# Patient Record
Sex: Female | Born: 1945 | Race: Black or African American | Hispanic: No | Smoking: Former smoker
Health system: Southern US, Community
[De-identification: ages and names within clinical notes are randomized; demographics above are authoritative.]

## PROBLEM LIST (undated history)

## (undated) DIAGNOSIS — D649 Anemia, unspecified: Secondary | ICD-10-CM

## (undated) DIAGNOSIS — K649 Unspecified hemorrhoids: Secondary | ICD-10-CM

## (undated) DIAGNOSIS — E8881 Metabolic syndrome: Secondary | ICD-10-CM

## (undated) DIAGNOSIS — R42 Dizziness and giddiness: Secondary | ICD-10-CM

## (undated) DIAGNOSIS — L919 Hypertrophic disorder of the skin, unspecified: Secondary | ICD-10-CM

## (undated) DIAGNOSIS — I10 Essential (primary) hypertension: Secondary | ICD-10-CM

## (undated) DIAGNOSIS — R32 Unspecified urinary incontinence: Secondary | ICD-10-CM

## (undated) DIAGNOSIS — L909 Atrophic disorder of skin, unspecified: Secondary | ICD-10-CM

## (undated) DIAGNOSIS — M1990 Unspecified osteoarthritis, unspecified site: Secondary | ICD-10-CM

## (undated) DIAGNOSIS — M2550 Pain in unspecified joint: Secondary | ICD-10-CM

## (undated) DIAGNOSIS — T7840XA Allergy, unspecified, initial encounter: Secondary | ICD-10-CM

## (undated) DIAGNOSIS — E785 Hyperlipidemia, unspecified: Secondary | ICD-10-CM

## (undated) DIAGNOSIS — H9319 Tinnitus, unspecified ear: Secondary | ICD-10-CM

## (undated) DIAGNOSIS — J45909 Unspecified asthma, uncomplicated: Secondary | ICD-10-CM

## (undated) DIAGNOSIS — L600 Ingrowing nail: Secondary | ICD-10-CM

## (undated) DIAGNOSIS — R51 Headache: Secondary | ICD-10-CM

## (undated) DIAGNOSIS — H9190 Unspecified hearing loss, unspecified ear: Secondary | ICD-10-CM

## (undated) DIAGNOSIS — D869 Sarcoidosis, unspecified: Secondary | ICD-10-CM

## (undated) DIAGNOSIS — R0681 Apnea, not elsewhere classified: Secondary | ICD-10-CM

## (undated) DIAGNOSIS — G43909 Migraine, unspecified, not intractable, without status migrainosus: Secondary | ICD-10-CM

## (undated) DIAGNOSIS — J309 Allergic rhinitis, unspecified: Secondary | ICD-10-CM

## (undated) DIAGNOSIS — K219 Gastro-esophageal reflux disease without esophagitis: Secondary | ICD-10-CM

## (undated) DIAGNOSIS — G5600 Carpal tunnel syndrome, unspecified upper limb: Secondary | ICD-10-CM

## (undated) DIAGNOSIS — R7989 Other specified abnormal findings of blood chemistry: Secondary | ICD-10-CM

## (undated) DIAGNOSIS — R7309 Other abnormal glucose: Secondary | ICD-10-CM

## (undated) DIAGNOSIS — M109 Gout, unspecified: Secondary | ICD-10-CM

## (undated) DIAGNOSIS — R011 Cardiac murmur, unspecified: Secondary | ICD-10-CM

## (undated) DIAGNOSIS — G4733 Obstructive sleep apnea (adult) (pediatric): Secondary | ICD-10-CM

## (undated) DIAGNOSIS — L821 Other seborrheic keratosis: Secondary | ICD-10-CM

## (undated) HISTORY — DX: Hypertrophic disorder of the skin, unspecified: L91.9

## (undated) HISTORY — DX: Morbid (severe) obesity due to excess calories: E66.01

## (undated) HISTORY — DX: Allergic rhinitis, unspecified: J30.9

## (undated) HISTORY — DX: Metabolic syndrome: E88.81

## (undated) HISTORY — PX: INNER EAR SURGERY: SHX679

## (undated) HISTORY — DX: Other seborrheic keratosis: L82.1

## (undated) HISTORY — DX: Migraine, unspecified, not intractable, without status migrainosus: G43.909

## (undated) HISTORY — DX: Other abnormal glucose: R73.09

## (undated) HISTORY — PX: CATARACT EXTRACTION: SUR2

## (undated) HISTORY — DX: Unspecified hearing loss, unspecified ear: H91.90

## (undated) HISTORY — DX: Dizziness and giddiness: R42

## (undated) HISTORY — DX: Unspecified hemorrhoids: K64.9

## (undated) HISTORY — DX: Essential (primary) hypertension: I10

## (undated) HISTORY — DX: Allergy, unspecified, initial encounter: T78.40XA

## (undated) HISTORY — DX: Unspecified osteoarthritis, unspecified site: M19.90

## (undated) HISTORY — DX: Ingrowing nail: L60.0

## (undated) HISTORY — DX: Unspecified asthma, uncomplicated: J45.909

## (undated) HISTORY — DX: Anemia, unspecified: D64.9

## (undated) HISTORY — DX: Unspecified urinary incontinence: R32

## (undated) HISTORY — DX: Cardiac murmur, unspecified: R01.1

## (undated) HISTORY — DX: Metabolic syndrome: E88.810

## (undated) HISTORY — DX: Headache: R51

## (undated) HISTORY — DX: Sarcoidosis, unspecified: D86.9

## (undated) HISTORY — DX: Apnea, not elsewhere classified: R06.81

## (undated) HISTORY — PX: BACK SURGERY: SHX140

## (undated) HISTORY — PX: CARPAL TUNNEL RELEASE: SHX101

## (undated) HISTORY — DX: Atrophic disorder of skin, unspecified: L90.9

## (undated) HISTORY — DX: Carpal tunnel syndrome, unspecified upper limb: G56.00

## (undated) HISTORY — PX: OTHER SURGICAL HISTORY: SHX169

## (undated) HISTORY — DX: Gastro-esophageal reflux disease without esophagitis: K21.9

## (undated) HISTORY — DX: Pain in unspecified joint: M25.50

## (undated) HISTORY — PX: PARTIAL HYSTERECTOMY: SHX80

## (undated) HISTORY — PX: CARDIAC CATHETERIZATION: SHX172

## (undated) HISTORY — PX: BLADDER SUSPENSION: SHX72

## (undated) HISTORY — DX: Gout, unspecified: M10.9

## (undated) HISTORY — DX: Tinnitus, unspecified ear: H93.19

## (undated) HISTORY — DX: Obstructive sleep apnea (adult) (pediatric): G47.33

## (undated) HISTORY — DX: Other specified abnormal findings of blood chemistry: R79.89

## (undated) HISTORY — DX: Hyperlipidemia, unspecified: E78.5

---

## 2005-03-10 ENCOUNTER — Emergency Department: Payer: Self-pay | Admitting: Emergency Medicine

## 2006-07-09 ENCOUNTER — Emergency Department: Payer: Self-pay | Admitting: Emergency Medicine

## 2006-09-04 ENCOUNTER — Emergency Department: Payer: Self-pay | Admitting: Emergency Medicine

## 2007-06-18 ENCOUNTER — Emergency Department: Payer: Self-pay | Admitting: Emergency Medicine

## 2007-06-19 ENCOUNTER — Emergency Department: Payer: Self-pay | Admitting: Internal Medicine

## 2008-02-07 ENCOUNTER — Ambulatory Visit: Payer: Self-pay | Admitting: Family Medicine

## 2008-02-12 ENCOUNTER — Ambulatory Visit: Payer: Self-pay | Admitting: Gastroenterology

## 2008-04-25 ENCOUNTER — Emergency Department: Payer: Self-pay | Admitting: Emergency Medicine

## 2008-07-22 ENCOUNTER — Emergency Department: Payer: Self-pay | Admitting: Emergency Medicine

## 2008-10-24 ENCOUNTER — Ambulatory Visit: Payer: Self-pay | Admitting: Family Medicine

## 2009-09-01 ENCOUNTER — Encounter: Payer: Self-pay | Admitting: Family Medicine

## 2009-09-16 ENCOUNTER — Encounter: Payer: Self-pay | Admitting: Family Medicine

## 2009-10-17 ENCOUNTER — Encounter: Payer: Self-pay | Admitting: Family Medicine

## 2009-11-17 ENCOUNTER — Encounter: Payer: Self-pay | Admitting: Family Medicine

## 2009-12-15 ENCOUNTER — Encounter: Payer: Self-pay | Admitting: Family Medicine

## 2010-01-15 ENCOUNTER — Encounter: Payer: Self-pay | Admitting: Family Medicine

## 2010-02-14 ENCOUNTER — Encounter: Payer: Self-pay | Admitting: Family Medicine

## 2010-03-17 ENCOUNTER — Encounter: Payer: Self-pay | Admitting: Family Medicine

## 2010-06-25 ENCOUNTER — Emergency Department: Payer: Self-pay | Admitting: Emergency Medicine

## 2010-10-17 DIAGNOSIS — K649 Unspecified hemorrhoids: Secondary | ICD-10-CM

## 2010-10-17 HISTORY — DX: Unspecified hemorrhoids: K64.9

## 2010-10-17 HISTORY — PX: COLONOSCOPY: SHX174

## 2011-02-08 ENCOUNTER — Ambulatory Visit: Payer: Self-pay | Admitting: Family Medicine

## 2011-02-27 ENCOUNTER — Emergency Department: Payer: Self-pay | Admitting: Emergency Medicine

## 2011-05-06 ENCOUNTER — Ambulatory Visit: Payer: Self-pay | Admitting: Internal Medicine

## 2011-05-18 ENCOUNTER — Ambulatory Visit: Payer: Self-pay | Admitting: Internal Medicine

## 2011-06-18 ENCOUNTER — Ambulatory Visit: Payer: Self-pay | Admitting: Internal Medicine

## 2011-06-22 ENCOUNTER — Ambulatory Visit: Payer: Self-pay | Admitting: Gastroenterology

## 2011-06-24 ENCOUNTER — Ambulatory Visit: Payer: Self-pay | Admitting: Gastroenterology

## 2011-06-24 LAB — PATHOLOGY REPORT

## 2011-07-18 ENCOUNTER — Ambulatory Visit: Payer: Self-pay

## 2011-07-18 ENCOUNTER — Ambulatory Visit: Payer: Self-pay | Admitting: Internal Medicine

## 2011-09-02 ENCOUNTER — Ambulatory Visit: Payer: Self-pay | Admitting: Internal Medicine

## 2011-09-17 ENCOUNTER — Ambulatory Visit: Payer: Self-pay | Admitting: Internal Medicine

## 2011-10-24 ENCOUNTER — Ambulatory Visit: Payer: Self-pay | Admitting: Internal Medicine

## 2011-10-27 ENCOUNTER — Ambulatory Visit: Payer: Self-pay | Admitting: Rheumatology

## 2011-11-18 ENCOUNTER — Ambulatory Visit: Payer: Self-pay | Admitting: Internal Medicine

## 2011-12-03 IMAGING — CR DG CHEST 2V
1 series · 2 of 2 positions shown · non-contrast
Comparison: none

REASON FOR EXAM: cough and fever      Flex 6
COMMENTS:   LMP: Post Hysterectomy

PROCEDURE:     DXR - DXR CHEST PA (OR AP) AND LATERAL  - February 27, 2011 [DATE]
RESULT:     Comparison: None.

[Series 1: view not recorded · 0.17mm/px · 2 of 2 slices shown]
[im 1/2]
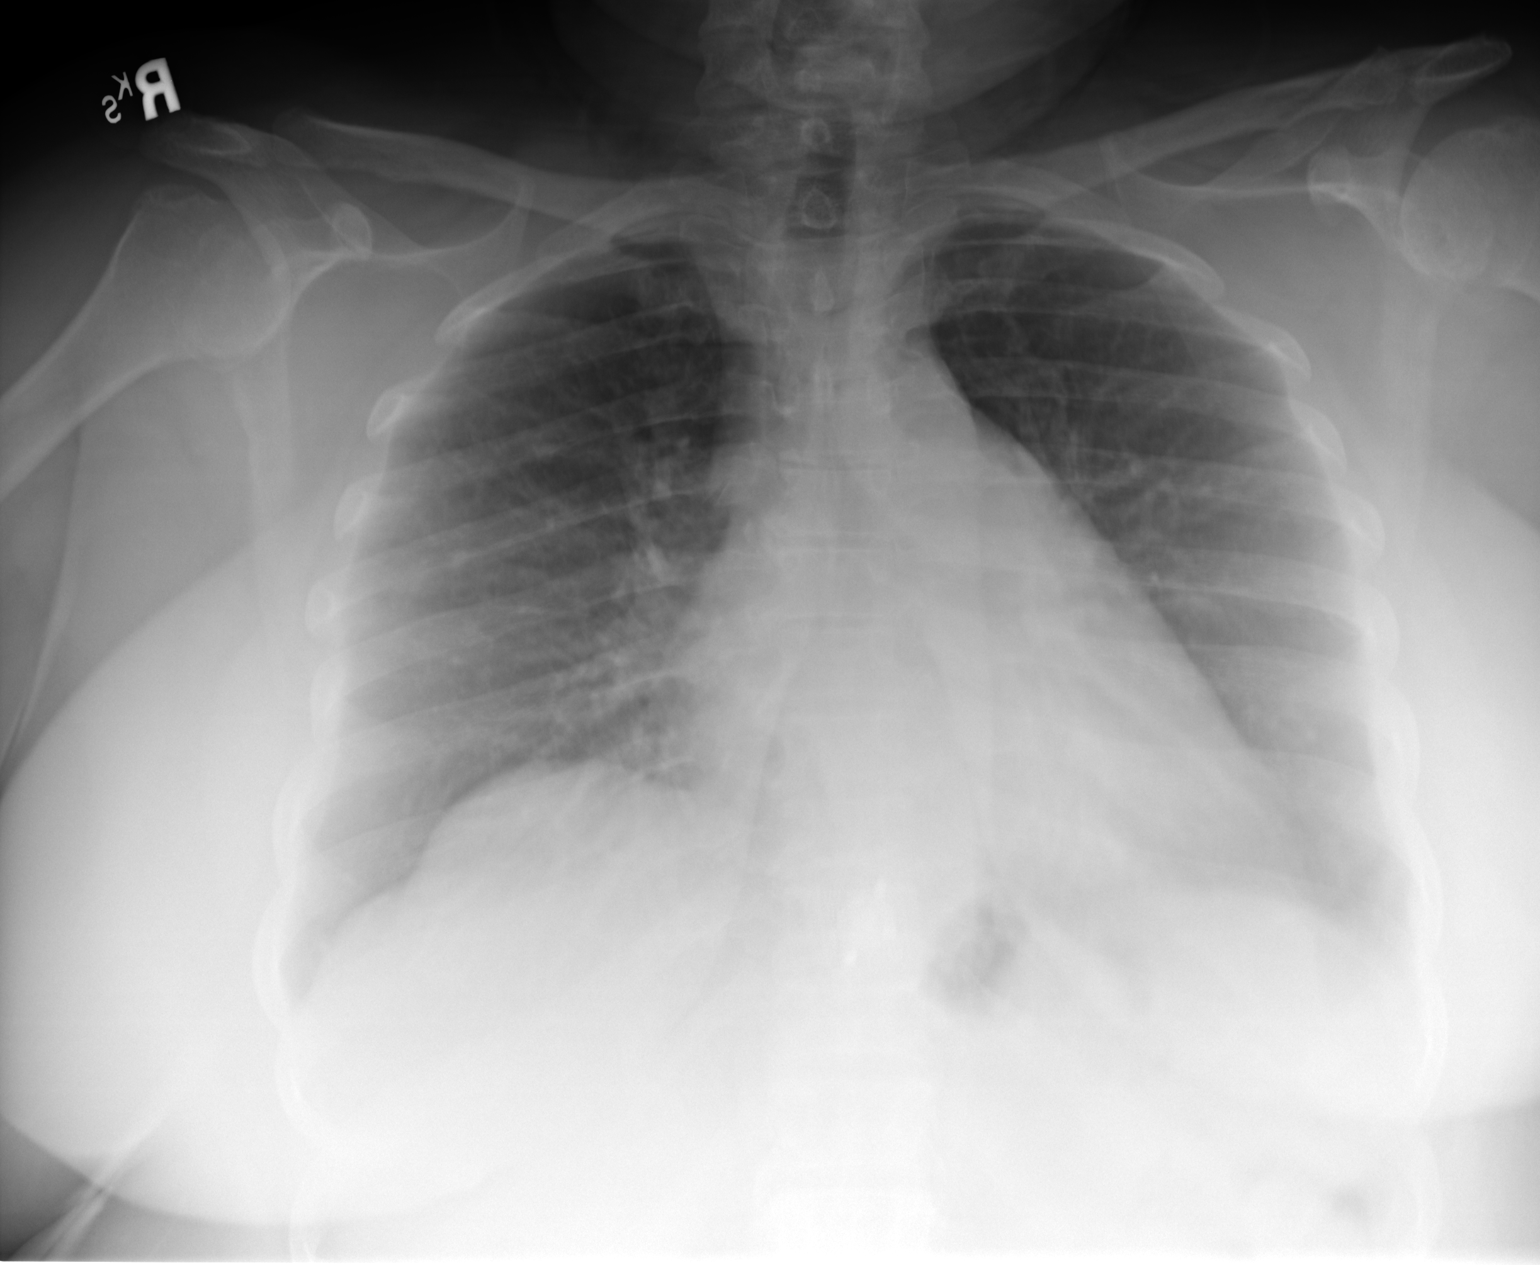
[im 2/2]
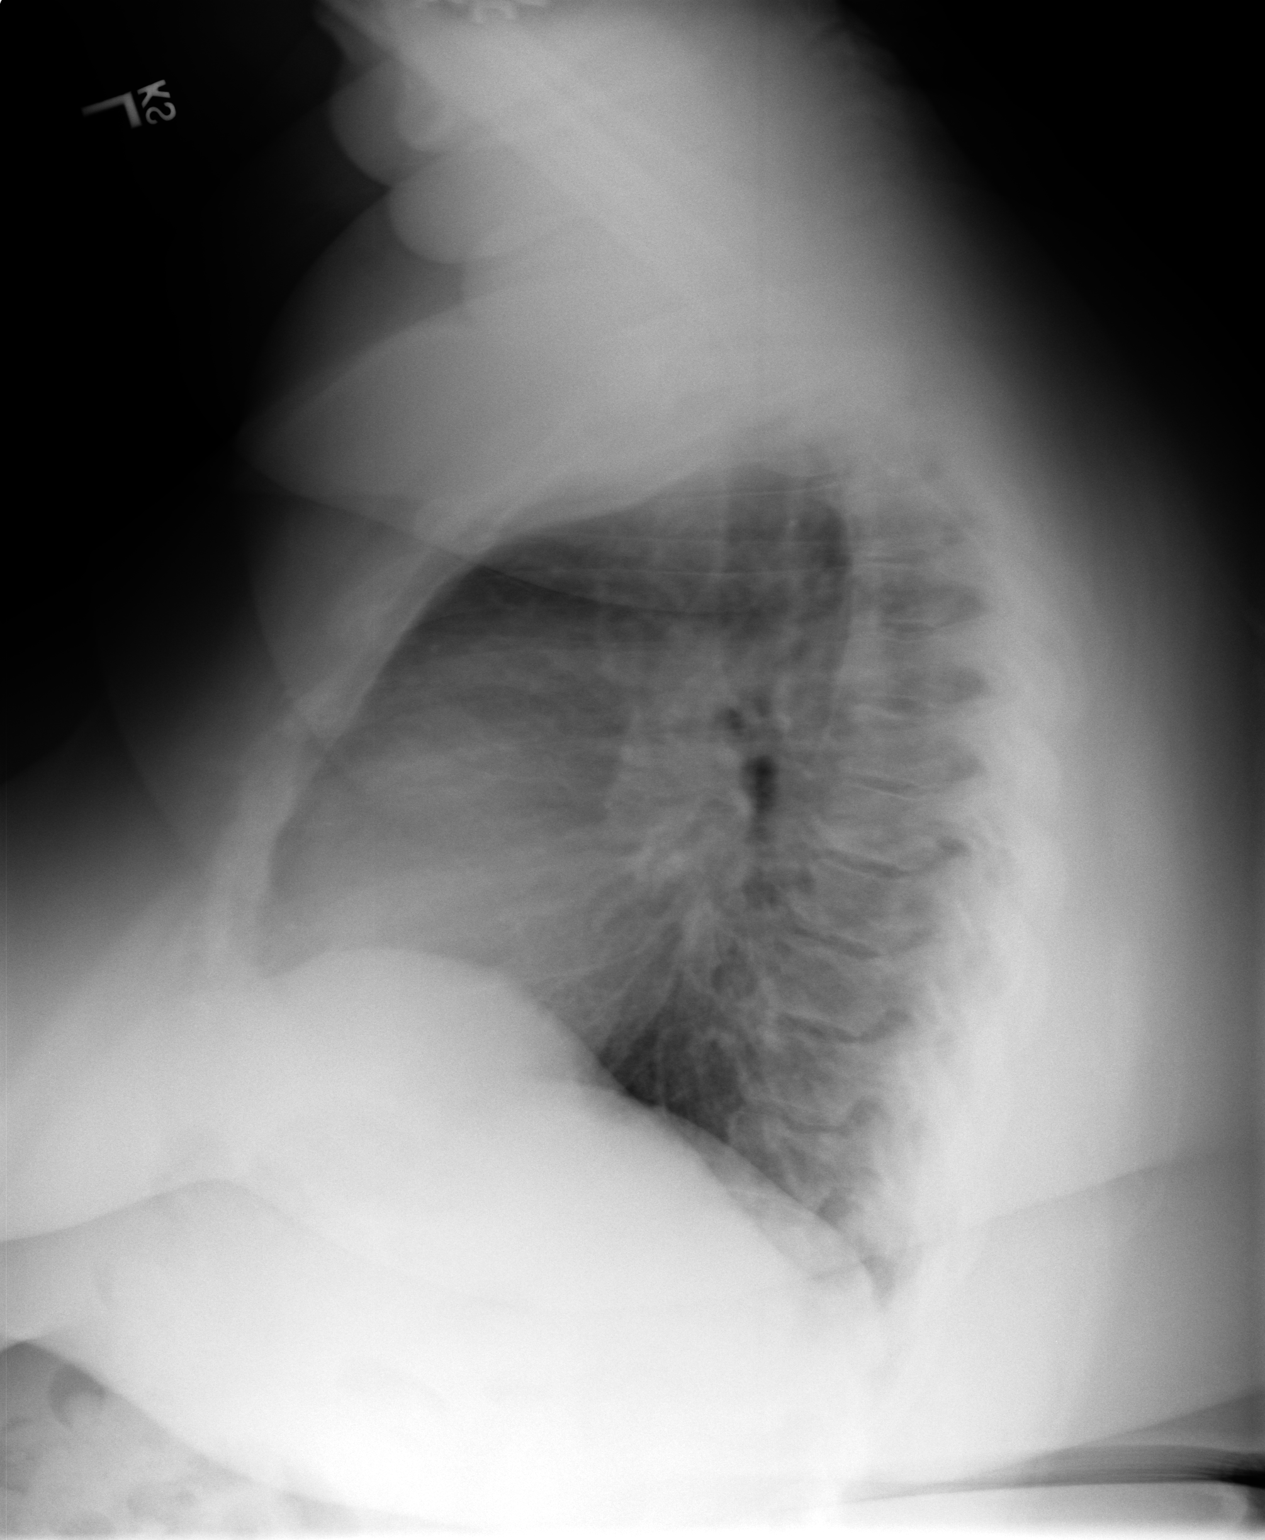

[2 of 2 positions shown; findings below may reference images not displayed]

FINDINGS: The heart size upper limits of normal. There is mild prominence of the
pulmonary interstitium.
IMPRESSION: Mild prominence of the pulmonary interstitium may be artifactual related to
overlying soft tissue. Interstitial edema or atypical infection are of
differential consideration.

## 2012-02-07 ENCOUNTER — Encounter: Payer: Self-pay | Admitting: Family Medicine

## 2012-02-15 ENCOUNTER — Encounter: Payer: Self-pay | Admitting: Family Medicine

## 2012-10-31 ENCOUNTER — Ambulatory Visit: Payer: Self-pay | Admitting: Family Medicine

## 2012-11-28 ENCOUNTER — Ambulatory Visit: Payer: Self-pay | Admitting: Family Medicine

## 2013-08-12 ENCOUNTER — Encounter: Payer: Self-pay | Admitting: Rheumatology

## 2013-08-17 ENCOUNTER — Encounter: Payer: Self-pay | Admitting: Rheumatology

## 2013-09-10 ENCOUNTER — Encounter: Payer: Self-pay | Admitting: *Deleted

## 2013-09-25 ENCOUNTER — Ambulatory Visit (INDEPENDENT_AMBULATORY_CARE_PROVIDER_SITE_OTHER): Payer: PRIVATE HEALTH INSURANCE | Admitting: General Surgery

## 2013-09-25 ENCOUNTER — Encounter: Payer: Self-pay | Admitting: General Surgery

## 2013-09-25 VITALS — BP 128/76 | HR 72 | Resp 16 | Ht 61.0 in | Wt 305.0 lb

## 2013-09-25 DIAGNOSIS — D2360 Other benign neoplasm of skin of unspecified upper limb, including shoulder: Secondary | ICD-10-CM

## 2013-09-25 DIAGNOSIS — L989 Disorder of the skin and subcutaneous tissue, unspecified: Secondary | ICD-10-CM

## 2013-09-25 NOTE — Patient Instructions (Addendum)
Keep area dry and clean 2-3 days then apply neosporin ointment and bandaid Return for suture removal

## 2013-09-25 NOTE — Progress Notes (Signed)
Patient ID: Joyce Fields, female   DOB: Feb 27, 1946, 67 y.o.   MRN: [id number]  Chief Complaint  Patient presents with  . Other    lesion left thumb    HPI Joyce Fields is a 67 y.o. female.  Here today for evaluation of lesion left thumb referred by Dr. Carlynn Purl.  States it started as a hard knot around March 2014 and it has gotten larger and painful.  She went to [facility] and was placed on antibiotic in November. A culture and biopsy was done 08-20-13. HPI  Past Medical History  Diagnosis Date  . Asthma   . Arthritis   . Gout   . Hyperlipidemia   . Allergy   . Vertigo   . Sarcoidosis   . Hypertension   . GERD (gastroesophageal reflux disease)   . Apnea     CPAP  . Anemia   . Hemorrhoid 2012    Past Surgical History  Procedure Laterality Date  . Carpal tunnel release Bilateral   . Partial hysterectomy    . Inner ear surgery    . Back surgery      nerve compression  . Cataract extraction Bilateral   . Colonoscopy  2012    History reviewed. No pertinent family history.  Social History History  Substance Use Topics  . Smoking status: Never Smoker   . Smokeless tobacco: Never Used  . Alcohol Use: No    Allergies  Allergen Reactions  . Niaspan [Niacin Er] Other (See Comments)    Hot flashes  . Welchol [Colesevelam Hcl] Nausea And Vomiting    indigestion    Current Outpatient Prescriptions  Medication Sig Dispense Refill  . acetaminophen (TYLENOL) 500 MG tablet Take 500 mg by mouth every 6 (six) hours as needed.      Marland Kitchen albuterol (PROVENTIL HFA;VENTOLIN HFA) 108 (90 BASE) MCG/ACT inhaler Inhale into the lungs every 6 (six) hours as needed for wheezing or shortness of breath.      . ALPRAZolam (XANAX) 0.5 MG tablet Take 0.5 mg by mouth at bedtime as needed for anxiety.      Marland Kitchen amLODipine-olmesartan (AZOR) 5-20 MG per tablet Take 1 tablet by mouth daily.      Marland Kitchen aspirin 81 MG tablet Take 81 mg by mouth daily.      . cetirizine (ZYRTEC) 10 MG  tablet Take 10 mg by mouth daily.      Marland Kitchen desonide (DESOWEN) 0.05 % lotion Apply topically 2 (two) times daily.      . Ergocalciferol (VITAMIN D2) 2000 UNITS TABS Take by mouth daily.      Marland Kitchen FLUoxetine (PROZAC) 40 MG capsule Take 40 mg by mouth daily.      . fluticasone (FLONASE) 50 MCG/ACT nasal spray Place 2 sprays into both nostrils daily.      . furosemide (LASIX) 20 MG tablet Take 20 mg by mouth daily.      Marland Kitchen gabapentin (NEURONTIN) 300 MG capsule Take 300 mg by mouth 3 (three) times daily.      . Gabapentin, PHN, (GRALISE) 600 MG TABS Take by mouth 3 (three) times daily.      Marland Kitchen omeprazole (PRILOSEC) 20 MG capsule Take 20 mg by mouth daily.      . rosuvastatin (CRESTOR) 20 MG tablet Take 20 mg by mouth daily.      . solifenacin (VESICARE) 10 MG tablet Take 10 mg by mouth daily.      . traMADol (ULTRAM) 50 MG tablet  Take 50 mg by mouth 3 (three) times daily.       No current facility-administered medications for this visit.    Review of Systems Review of Systems  Constitutional: Negative.   Respiratory: Negative.   Cardiovascular: Negative.     Blood pressure 128/76, pulse 72, resp. rate 16, height 5\' 1"  (1.549 m), weight 305 lb (138.347 kg).  Physical Exam Physical Exam  Constitutional: She is oriented to person, place, and time. She appears well-developed and well-nourished.  Neurological: She is alert and oriented to person, place, and time.  Skin: Skin is warm and dry.  Left thumb has a 4 mm firm wart tender area in pulp. It is not adherent to the deeper structures.     Data Reviewed Office notes, culture and biopsy results reviewed.   Assessment    Left thumb has a 4 mm firm wart tender area in pulp. It is not adherent to the deeper structures.    Plan    Plan for excision of this area of concern. Explained procedure and pt was agreeable.  Procedure note: Digital block of the left thumb was obtained by installation of 10 mL of 1% Xylocaine mixed with 0.5%  Marcaine. Left thumb was prepped with ChloraPrep and draped. The warty lesion in pulp of the thumb was elliptically excised. Bleeding controlled with disposable cautery. Skin was approximated with two 5-0 Prolene stitches. Dressing of Neosporin Telfa gauze and tube gauze. No immediate problems encountered from the procedure. Further wound care instructions were given.       Suriya Kovarik G 09/25/2013, 12:15 PM

## 2013-09-28 LAB — PATHOLOGY

## 2013-10-01 ENCOUNTER — Telehealth: Payer: Self-pay | Admitting: *Deleted

## 2013-10-01 NOTE — Telephone Encounter (Signed)
Message copied by Currie Paris on Tue Oct 01, 2013  8:35 AM ------      Message from: Kieth Brightly      Created: Sat Sep 28, 2013  9:43 AM       Please let pt pt know the pathology was normal. ------

## 2013-10-01 NOTE — Telephone Encounter (Signed)
Notified patient as instructed, patient pleased. Discussed follow-up appointments, patient agrees  

## 2013-10-08 ENCOUNTER — Ambulatory Visit: Payer: PRIVATE HEALTH INSURANCE | Admitting: *Deleted

## 2013-10-08 DIAGNOSIS — L989 Disorder of the skin and subcutaneous tissue, unspecified: Secondary | ICD-10-CM

## 2013-10-08 NOTE — Patient Instructions (Signed)
Call for questions or concerns.

## 2013-10-08 NOTE — Progress Notes (Signed)
Here today for wound check. Sutures removed, no signs of infection noted. Aware of pathology.  Will call for questions or concerns.  B/P checked 112/58, states she has been feeling weak. She is calling her PCP.

## 2014-03-17 ENCOUNTER — Encounter (INDEPENDENT_AMBULATORY_CARE_PROVIDER_SITE_OTHER): Payer: Self-pay

## 2014-03-17 ENCOUNTER — Encounter: Payer: Self-pay | Admitting: *Deleted

## 2014-03-17 ENCOUNTER — Ambulatory Visit (INDEPENDENT_AMBULATORY_CARE_PROVIDER_SITE_OTHER): Payer: PRIVATE HEALTH INSURANCE | Admitting: Cardiovascular Disease

## 2014-03-17 ENCOUNTER — Encounter: Payer: Self-pay | Admitting: Cardiovascular Disease

## 2014-03-17 VITALS — BP 132/70 | HR 69 | Ht 61.0 in | Wt 297.0 lb

## 2014-03-17 DIAGNOSIS — E785 Hyperlipidemia, unspecified: Secondary | ICD-10-CM | POA: Insufficient documentation

## 2014-03-17 DIAGNOSIS — R011 Cardiac murmur, unspecified: Secondary | ICD-10-CM | POA: Insufficient documentation

## 2014-03-17 DIAGNOSIS — R0789 Other chest pain: Secondary | ICD-10-CM

## 2014-03-17 DIAGNOSIS — I10 Essential (primary) hypertension: Secondary | ICD-10-CM

## 2014-03-17 NOTE — Assessment & Plan Note (Signed)
She has a heart murmur in the pulmonic area which could be a flow murmur. We will set up to exclude pulmonary hypertension given prolonged history of pulmonary sarcoidosis. Thus, I requested an echocardiogram for evaluation.

## 2014-03-17 NOTE — Assessment & Plan Note (Signed)
Blood pressure is well controlled on current medications. 

## 2014-03-17 NOTE — Patient Instructions (Addendum)
[place]  Your caregiver has ordered a Stress Test with nuclear imaging. The purpose of this test is to evaluate the blood supply to your heart muscle. This procedure is referred to as a "Non-Invasive Stress Test." This is because other than having an IV started in your vein, nothing is inserted or "invades" your body. Cardiac stress tests are done to find areas of poor blood flow to the heart by determining the extent of coronary artery disease (CAD). Some patients exercise on a treadmill, which naturally increases the blood flow to your heart, while others who are  unable to walk on a treadmill due to physical limitations have a pharmacologic/chemical stress agent called Lexiscan . This medicine will mimic walking on a treadmill by temporarily increasing your coronary blood flow.   Please note: these test may take anywhere between 2-4 hours to complete  PLEASE REPORT TO [place] AT THE FIRST DESK WILL DIRECT YOU WHERE TO GO  Date of Procedure:________6/3/15 and 6/4/15____________  Arrival Time for Procedure:_________10 am both days ____     PLEASE NOTIFY THE OFFICE AT LEAST 24 HOURS IN ADVANCE IF YOU ARE UNABLE TO KEEP YOUR APPOINTMENT.  [phone] AND  PLEASE NOTIFY NUCLEAR MEDICINE AT [facility] AT LEAST 24 HOURS IN ADVANCE IF YOU ARE UNABLE TO KEEP YOUR APPOINTMENT. [phone]  How to prepare for your Myoview test:  1. Do not eat or drink after midnight 2. No caffeine for 24 hours prior to test 3. No smoking 24 hours prior to test. 4. Your medication may be taken with water.  If your doctor stopped a medication because of this test, do not take that medication. 5. Ladies, please do not wear dresses.  Skirts or pants are appropriate. Please wear a short sleeve shirt. 6. No perfume, cologne or lotion. 7. Wear comfortable walking shoes. No heels!       Your physician has requested that you have an echocardiogram. Echocardiography is a painless  test that uses sound waves to create images of your heart. It provides your doctor with information about the size and shape of your heart and how well your heart's chambers and valves are working. This procedure takes approximately one hour. There are no restrictions for this procedure.   Your physician recommends that you schedule a follow-up appointment in:  As needed

## 2014-03-17 NOTE — Progress Notes (Signed)
Primary care physician: Dr. Ancil Boozer  HPI  This is a pleasant 68 year old female who was referred for evaluation of her cardiac murmur and chest pain. She is not aware of any previous cardiac history. She reports prolonged history of pulmonary sarcoidosis since 1994 which is being followed at [facility]. She has been on intermittent courses of prednisone. She was told recently told there about a new heart murmur. There is also possible history of chronic diastolic heart failure. Other medical conditions include GERD, morbid obesity, hyperlipidemia and sleep apnea. She noticed recent episodes of left-sided chest pain described as pressure which happens at rest and most of the time at night. It occasionally wakes her up from sleep. She feels a bad taste in her mouth. She reports that the symptoms are different from her usual reflux symptoms. She does complain of significant exertional dyspnea without exercise induced chest pain.   Allergies  Allergen Reactions  . Niaspan [Niacin Er] Other (See Comments)    Hot flashes  . Welchol [Colesevelam Hcl] Nausea And Vomiting    indigestion     Current Outpatient Prescriptions on File Prior to Visit  Medication Sig Dispense Refill  . albuterol (PROVENTIL HFA;VENTOLIN HFA) 108 (90 BASE) MCG/ACT inhaler Inhale into the lungs every 6 (six) hours as needed for wheezing or shortness of breath.      . ALPRAZolam (XANAX) 0.5 MG tablet Take 0.5 mg by mouth at bedtime as needed for anxiety.      Marland Kitchen amLODipine-olmesartan (AZOR) 5-20 MG per tablet Take 1 tablet by mouth daily.      Marland Kitchen aspirin 81 MG tablet Take 81 mg by mouth daily.      . cetirizine (ZYRTEC) 10 MG tablet Take 10 mg by mouth daily.      Marland Kitchen desonide (DESOWEN) 0.05 % lotion Apply topically 2 (two) times daily.      . Ergocalciferol (VITAMIN D2) 2000 UNITS TABS Take by mouth daily.      Marland Kitchen FLUoxetine (PROZAC) 40 MG capsule Take 40 mg by mouth daily.      . fluticasone (FLONASE) 50 MCG/ACT nasal spray Place 2  sprays into both nostrils as needed.       . furosemide (LASIX) 20 MG tablet Take 20 mg by mouth 2 (two) times daily.       Marland Kitchen omeprazole (PRILOSEC) 20 MG capsule Take 20 mg by mouth daily.      . rosuvastatin (CRESTOR) 20 MG tablet Take 20 mg by mouth daily.      . solifenacin (VESICARE) 10 MG tablet Take 10 mg by mouth daily.       No current facility-administered medications on file prior to visit.     Past Medical History  Diagnosis Date  . Asthma   . Arthritis   . Gout   . Allergy   . Vertigo   . Sarcoidosis   . GERD (gastroesophageal reflux disease)   . Apnea     CPAP  . Anemia   . Hemorrhoid 2012  . Unspecified hypertrophic and atrophic condition of skin   . Other abnormal glucose   . Osteoarthrosis, unspecified whether generalized or localized, unspecified site   . Ingrowing nail   . Other abnormal blood chemistry   . Pain in joint, site unspecified   . Other seborrheic keratosis   . Unspecified hearing loss   . Allergic rhinitis, cause unspecified   . Unspecified urinary incontinence   . Obstructive sleep apnea (adult) (pediatric)   . Headache(784.0)   .  Morbid obesity   . Dizziness and giddiness   . Dysmetabolic syndrome X   . Carpal tunnel syndrome   . Anemia, unspecified   . Unspecified tinnitus   . Vertigo   . Migraine   . Heart murmur   . Hyperlipidemia   . Hypertension      Past Surgical History  Procedure Laterality Date  . Carpal tunnel release Bilateral   . Partial hysterectomy    . Inner ear surgery    . Back surgery      nerve compression  . Cataract extraction Bilateral   . Colonoscopy  2012  . Bladder suspension    . Brain stem surgery    . Cardiac catheterization      [facility]     Family History  Problem Relation Age of Onset  . Hypertension Father      History   Social History  . Marital Status: Widowed    Spouse Name: N/A    Number of Children: N/A  . Years of Education: N/A   Occupational History  . Not on file.    Social History Main Topics  . Smoking status: Former Smoker -- 1.00 packs/day for 25 years    Types: Cigarettes  . Smokeless tobacco: Never Used  . Alcohol Use: No     Comment: occasional  . Drug Use: No  . Sexual Activity: Not on file   Other Topics Concern  . Not on file   Social History Narrative  . No narrative on file     ROS A 10 point review of system was performed. It is negative other than that mentioned in the history of present illness.   PHYSICAL EXAM   BP 132/70  Pulse 69  Ht 5\' 1"  (1.549 m)  Wt 297 lb (134.718 kg)  BMI 56.15 kg/m2 Constitutional: She is oriented to person, place, and time. She appears well-developed and well-nourished. No distress.  HENT: No nasal discharge.  Head: Normocephalic and atraumatic.  Eyes: Pupils are equal and round. No discharge.  Neck: Normal range of motion. Neck supple. No JVD present. No thyromegaly present.  Cardiovascular: Normal rate, regular rhythm, normal heart sounds. Exam reveals no gallop and no friction rub. There is a 2/6 systolic ejection murmur at the pulmonic valve area. Pulmonary/Chest: Effort normal and breath sounds normal. No stridor. No respiratory distress. She has no wheezes. She has no rales. She exhibits no tenderness.  Abdominal: Soft. Bowel sounds are normal. She exhibits no distension. There is no tenderness. There is no rebound and no guarding.  Musculoskeletal: Normal range of motion. She exhibits no edema and no tenderness.  Neurological: She is alert and oriented to person, place, and time. Coordination normal.  Skin: Skin is warm and dry. No rash noted. She is not diaphoretic. No erythema. No pallor.  Psychiatric: She has a normal mood and affect. Her behavior is normal. Judgment and thought content normal.     DGL:OVFIE  Rhythm  -  Negative precordial T-waves.   WITHIN NORMAL LIMITS   ASSESSMENT AND PLAN

## 2014-03-17 NOTE — Assessment & Plan Note (Signed)
Symptoms are suggestive of GERD although she reports that the current nature of chest pain is different from the usual heartburn. She has multiple risk factors for coronary artery disease and thus I requested a pharmacologic nuclear stress test. She is not able to exercise on a treadmill. Continue treatment of risk factors.

## 2014-03-19 ENCOUNTER — Ambulatory Visit: Payer: Self-pay | Admitting: Cardiovascular Disease

## 2014-03-19 DIAGNOSIS — R079 Chest pain, unspecified: Secondary | ICD-10-CM

## 2014-03-20 ENCOUNTER — Other Ambulatory Visit: Payer: Self-pay

## 2014-03-20 ENCOUNTER — Encounter: Payer: Self-pay | Admitting: *Deleted

## 2014-03-20 ENCOUNTER — Telehealth: Payer: Self-pay | Admitting: *Deleted

## 2014-03-20 DIAGNOSIS — Z01812 Encounter for preprocedural laboratory examination: Secondary | ICD-10-CM

## 2014-03-20 DIAGNOSIS — R9431 Abnormal electrocardiogram [ECG] [EKG]: Secondary | ICD-10-CM

## 2014-03-20 DIAGNOSIS — R079 Chest pain, unspecified: Secondary | ICD-10-CM

## 2014-03-20 DIAGNOSIS — R0789 Other chest pain: Secondary | ICD-10-CM

## 2014-03-20 NOTE — Telephone Encounter (Signed)
Message copied by Tracie Harrier on Thu Mar 20, 2014  4:41 PM ------      Message from: Kathlyn Sacramento A      Created: Thu Mar 20, 2014  4:11 PM       Inform patient that  stress test was abnormal and suggestive of blockages. I recommend proceeding with cardiac cath. We should do the echo before cath and should expedite the echo.        ------

## 2014-03-20 NOTE — Telephone Encounter (Signed)
Echo rescheduled for 03/21/14 Cardiac cath scheduled   [facility] Cardiac Cath Instructions   You are scheduled for a Cardiac Cath on:_________________________  Please arrive at _______am on the day of your procedure  You will need to pre-register prior to the day of your procedure.  Enter through the [employer] at [facility].  Registration is the first desk on your right.  Please take the procedure order we have given you in order to be registered appropriately  Do not eat/drink anything after midnight  Someone will need to drive you home  It is recommended someone be with you for the first 24 hours after your procedure  Wear clothes that are easy to get on/off and wear slip on shoes if possible   Medications bring a current list of all medications with you  Day of your procedure: Arrive at the [place] entrance.  Free valet service is available.  After entering the [place] please check-in at the registration desk (1st desk on your right) to receive your armband. After receiving your armband someone will escort you to the cardiac cath/special procedures waiting area.  The usual length of stay after your procedure is about 2 to 3 hours.  This can vary.  If you have any questions, please call our office at [phone], or you may call the cardiac cath lab at [facility] directly at [phone]   Your physician recommends that you have labs today: Please take paper orders to the [facility] registation desk today to have labs and chest x ray

## 2014-03-21 ENCOUNTER — Other Ambulatory Visit: Payer: Self-pay

## 2014-03-21 ENCOUNTER — Other Ambulatory Visit: Payer: PRIVATE HEALTH INSURANCE

## 2014-03-21 ENCOUNTER — Other Ambulatory Visit (INDEPENDENT_AMBULATORY_CARE_PROVIDER_SITE_OTHER): Payer: PRIVATE HEALTH INSURANCE

## 2014-03-21 DIAGNOSIS — R079 Chest pain, unspecified: Secondary | ICD-10-CM

## 2014-03-21 DIAGNOSIS — R011 Cardiac murmur, unspecified: Secondary | ICD-10-CM

## 2014-03-21 NOTE — Telephone Encounter (Signed)
Reviewed cath instructions with patient  Cath scheduled for 03/27/14 at 0730 am  Pt to arrive at 0630 am  Patient verbalized understanding  She agrees to go to [facility] for labs and CXR 03/24/14

## 2014-03-24 ENCOUNTER — Ambulatory Visit: Payer: Self-pay | Admitting: Cardiovascular Disease

## 2014-03-24 LAB — CBC WITH DIFFERENTIAL/PLATELET
BASOS ABS: 0 10*3/uL (ref 0.0–0.1)
Basophil %: 0.5 %
EOS PCT: 4.3 %
Eosinophil #: 0.4 10*3/uL (ref 0.0–0.7)
HCT: 35.9 % (ref 35.0–47.0)
HGB: 11.6 g/dL — AB (ref 12.0–16.0)
LYMPHS PCT: 27.8 %
Lymphocyte #: 2.6 10*3/uL (ref 1.0–3.6)
MCH: 28.1 pg (ref 26.0–34.0)
MCHC: 32.3 g/dL (ref 32.0–36.0)
MCV: 87 fL (ref 80–100)
Monocyte #: 0.7 x10 3/mm (ref 0.2–0.9)
Monocyte %: 8 %
NEUTROS PCT: 59.4 %
Neutrophil #: 5.5 10*3/uL (ref 1.4–6.5)
PLATELETS: 251 10*3/uL (ref 150–440)
RBC: 4.11 10*6/uL (ref 3.80–5.20)
RDW: 14.2 % (ref 11.5–14.5)
WBC: 9.2 10*3/uL (ref 3.6–11.0)

## 2014-03-24 LAB — BASIC METABOLIC PANEL
ANION GAP: 4 — AB (ref 7–16)
BUN: 15 mg/dL (ref 7–18)
CALCIUM: 8.4 mg/dL — AB (ref 8.5–10.1)
CO2: 30 mmol/L (ref 21–32)
Chloride: 104 mmol/L (ref 98–107)
Creatinine: 1.09 mg/dL (ref 0.60–1.30)
EGFR (African American): >60
GFR CALC NON AF AMER: 53 — AB
GLUCOSE: 91 mg/dL (ref 65–99)
Osmolality: 276 (ref 275–301)
POTASSIUM: 3.8 mmol/L (ref 3.5–5.1)
SODIUM: 138 mmol/L (ref 136–145)

## 2014-03-24 LAB — PROTIME-INR
INR: 1
PROTHROMBIN TIME: 13.3 s (ref 11.5–14.7)

## 2014-03-25 ENCOUNTER — Other Ambulatory Visit: Payer: Self-pay

## 2014-03-25 DIAGNOSIS — R9431 Abnormal electrocardiogram [ECG] [EKG]: Secondary | ICD-10-CM

## 2014-03-25 DIAGNOSIS — Z01812 Encounter for preprocedural laboratory examination: Secondary | ICD-10-CM

## 2014-03-25 DIAGNOSIS — R079 Chest pain, unspecified: Secondary | ICD-10-CM

## 2014-03-26 ENCOUNTER — Telehealth: Payer: Self-pay | Admitting: *Deleted

## 2014-03-26 NOTE — Telephone Encounter (Signed)
Faxed cath orders  Joyce Fields in specials confirmed receipt   

## 2014-03-27 ENCOUNTER — Ambulatory Visit: Payer: Self-pay | Admitting: Cardiovascular Disease

## 2014-03-27 DIAGNOSIS — R079 Chest pain, unspecified: Secondary | ICD-10-CM

## 2014-03-27 HISTORY — PX: CARDIAC CATHETERIZATION: SHX172

## 2014-04-03 ENCOUNTER — Other Ambulatory Visit: Payer: PRIVATE HEALTH INSURANCE

## 2014-04-10 ENCOUNTER — Ambulatory Visit (INDEPENDENT_AMBULATORY_CARE_PROVIDER_SITE_OTHER): Payer: PRIVATE HEALTH INSURANCE | Admitting: Cardiovascular Disease

## 2014-04-10 ENCOUNTER — Encounter: Payer: Self-pay | Admitting: Cardiovascular Disease

## 2014-04-10 VITALS — BP 144/84 | HR 67 | Ht 61.0 in | Wt 294.8 lb

## 2014-04-10 DIAGNOSIS — R0789 Other chest pain: Secondary | ICD-10-CM

## 2014-04-10 DIAGNOSIS — I10 Essential (primary) hypertension: Secondary | ICD-10-CM

## 2014-04-10 DIAGNOSIS — I5032 Chronic diastolic (congestive) heart failure: Secondary | ICD-10-CM

## 2014-04-10 DIAGNOSIS — R079 Chest pain, unspecified: Secondary | ICD-10-CM

## 2014-04-10 MED ORDER — POTASSIUM CHLORIDE CRYS ER 20 MEQ PO TBCR: 20.0000 meq | EXTENDED_RELEASE_TABLET | Freq: Every day | ORAL | Status: AC

## 2014-04-10 MED ORDER — FUROSEMIDE 40 MG PO TABS: ORAL_TABLET | ORAL | Status: DC

## 2014-04-10 NOTE — Patient Instructions (Signed)
Medication changes:  Increase potassium to 27meq daily                                     Increase lasix to 40mg  in the morning & 20 mg in the evening  Your physician recommends that you return for lab work Thursday July 2nd for a BMP We will call you with your results   Your physician recommends that you schedule a follow-up appointment in: 1 month with Dr. Fletcher Anon

## 2014-04-10 NOTE — Progress Notes (Signed)
Primary care physician: Dr. Ancil Boozer  HPI  This is a pleasant 68 year old female who is here today for followup visit regarding  chest pain and recent cardiac catheterization. She r has prolonged history of pulmonary sarcoidosis since 1994 which is being followed at [facility]. She has been on intermittent courses of prednisone. She was told recently told there about a new heart murmur. There is also possible history of chronic diastolic heart failure. Other medical conditions include GERD, morbid obesity, hyperlipidemia and sleep apnea. She was seen recently for left-sided chest pain described as pressure which happens at rest and most of the time at night. It occasionally wakes her up from sleep. She feels a bad taste in her mouth. She reports that the symptoms are different from her usual reflux symptoms. She does complain of significant exertional dyspnea without exercise induced chest pain.  She underwent a pharmacologic nuclear stress test which was very suboptimal due to attenuation artifact. It was suggestive of possible anterior and inferior ischemia with ejection fraction of 50%. Echocardiogram showed normal LV systolic function with ejection fraction of 60%, mild grade 1 diastolic dysfunction, mild tricuspid regurgitation and borderline pulmonary hypertension with systolic pulmonary pressure of 41 mm mercury. I proceeded with cardiac catheterization via the right radial artery which showed minor luminal irregularities with no obstructive disease. LVEDP was 22. She continues to have dyspnea mostly at night as well as orthopnea. She wears the CPAP machine.   Allergies  Allergen Reactions  . Niaspan [Niacin Er] Other (See Comments)    Hot flashes  . Welchol [Colesevelam Hcl] Nausea And Vomiting    indigestion     Current Outpatient Prescriptions on File Prior to Visit  Medication Sig Dispense Refill  . acetaminophen (TYLENOL) 325 MG tablet Take 650 mg by mouth as needed.      Marland Kitchen albuterol  (PROVENTIL HFA;VENTOLIN HFA) 108 (90 BASE) MCG/ACT inhaler Inhale into the lungs every 6 (six) hours as needed for wheezing or shortness of breath.      . allopurinol (ZYLOPRIM) 100 MG tablet Take 100 mg by mouth daily.      Marland Kitchen ALPRAZolam (XANAX) 0.5 MG tablet Take 0.5 mg by mouth at bedtime as needed for anxiety.      Marland Kitchen amLODipine-olmesartan (AZOR) 5-20 MG per tablet Take 1 tablet by mouth daily.      Marland Kitchen aspirin 81 MG tablet Take 81 mg by mouth daily.      . cetirizine (ZYRTEC) 10 MG tablet Take 10 mg by mouth daily.      Marland Kitchen desonide (DESOWEN) 0.05 % lotion Apply topically 2 (two) times daily.      . Ergocalciferol (VITAMIN D2) 2000 UNITS TABS Take by mouth daily.      Marland Kitchen FLUoxetine (PROZAC) 40 MG capsule Take 40 mg by mouth daily.      . fluticasone (FLONASE) 50 MCG/ACT nasal spray Place 2 sprays into both nostrils as needed.       . Gabapentin, PHN, (GRALISE) 300 MG TABS Take by mouth 3 (three) times daily.      . hydrOXYzine (ATARAX/VISTARIL) 10 MG tablet Take 10 mg by mouth as needed.      Marland Kitchen omeprazole (PRILOSEC) 20 MG capsule Take 20 mg by mouth daily.      . rosuvastatin (CRESTOR) 20 MG tablet Take 20 mg by mouth daily.      . solifenacin (VESICARE) 10 MG tablet Take 10 mg by mouth daily.       No current facility-administered medications on  file prior to visit.     Past Medical History  Diagnosis Date  . Asthma   . Arthritis   . Gout   . Allergy   . Vertigo   . Sarcoidosis   . GERD (gastroesophageal reflux disease)   . Apnea     CPAP  . Anemia   . Hemorrhoid 2012  . Unspecified hypertrophic and atrophic condition of skin   . Other abnormal glucose   . Osteoarthrosis, unspecified whether generalized or localized, unspecified site   . Ingrowing nail   . Other abnormal blood chemistry   . Pain in joint, site unspecified   . Other seborrheic keratosis   . Unspecified hearing loss   . Allergic rhinitis, cause unspecified   . Unspecified urinary incontinence   . Obstructive  sleep apnea (adult) (pediatric)   . Headache(784.0)   . Morbid obesity   . Dizziness and giddiness   . Dysmetabolic syndrome X   . Carpal tunnel syndrome   . Anemia, unspecified   . Unspecified tinnitus   . Vertigo   . Migraine   . Heart murmur   . Hyperlipidemia   . Hypertension      Past Surgical History  Procedure Laterality Date  . Carpal tunnel release Bilateral   . Partial hysterectomy    . Inner ear surgery    . Back surgery      nerve compression  . Cataract extraction Bilateral   . Colonoscopy  2012  . Bladder suspension    . Brain stem surgery    . Cardiac catheterization      [facility]  . Cardiac catheterization  03/27/2014    [facility]     Family History  Problem Relation Age of Onset  . Hypertension Father      History   Social History  . Marital Status: Widowed    Spouse Name: N/A    Number of Children: N/A  . Years of Education: N/A   Occupational History  . Not on file.   Social History Main Topics  . Smoking status: Former Smoker -- 1.00 packs/day for 25 years    Types: Cigarettes  . Smokeless tobacco: Never Used  . Alcohol Use: No     Comment: occasional  . Drug Use: No  . Sexual Activity: Not on file   Other Topics Concern  . Not on file   Social History Narrative  . No narrative on file     ROS A 10 point review of system was performed. It is negative other than that mentioned in the history of present illness.   PHYSICAL EXAM   BP 144/84  Pulse 67  Ht 5\' 1"  (1.549 m)  Wt 294 lb 12.8 oz (133.72 kg)  BMI 55.73 kg/m2 Constitutional: She is oriented to person, place, and time. She appears well-developed and well-nourished. No distress.  HENT: No nasal discharge.  Head: Normocephalic and atraumatic.  Eyes: Pupils are equal and round. No discharge.  Neck: Normal range of motion. Neck supple. No JVD present. No thyromegaly present.  Cardiovascular: Normal rate, regular rhythm, normal heart sounds. Exam reveals no gallop and no  friction rub. There is a 2/6 systolic ejection murmur at the pulmonic valve area. Pulmonary/Chest: Effort normal and breath sounds normal. No stridor. No respiratory distress. She has no wheezes. She has no rales. She exhibits no tenderness.  Abdominal: Soft. Bowel sounds are normal. She exhibits no distension. There is no tenderness. There is no rebound and no guarding.  Musculoskeletal:  Normal range of motion. She exhibits no edema and no tenderness.  Neurological: She is alert and oriented to person, place, and time. Coordination normal.  Skin: Skin is warm and dry. No rash noted. She is not diaphoretic. No erythema. No pallor.  Psychiatric: She has a normal mood and affect. Her behavior is normal. Judgment and thought content normal.  Right radial pulse is normal with no hematoma   [id number]  Rhythm  WITHIN NORMAL LIMITS   ASSESSMENT AND PLAN

## 2014-04-10 NOTE — Assessment & Plan Note (Signed)
The patient continues to have symptoms of nocturnal dyspnea and orthopnea. Left ventricular end-diastolic pressure was noted to be elevated on cardiac catheterization suggestive of diastolic dysfunction. Given her continued symptoms, I increased the dose of furosemide to 40 mg in the morning and 20 mg in the afternoon on and increase potassium to 20 mEq once daily. Check basic metabolic profile in one week. I discussed with him the importance of low sodium diet.

## 2014-04-10 NOTE — Assessment & Plan Note (Signed)
Blood pressure is mildly elevated

## 2014-04-10 NOTE — Assessment & Plan Note (Signed)
Cardiac catheterization showed no evidence of obstructive coronary artery disease. Symptoms could be related to fluid overload or gastroesophageal reflux disease.

## 2014-04-17 ENCOUNTER — Other Ambulatory Visit: Payer: PRIVATE HEALTH INSURANCE

## 2014-04-21 ENCOUNTER — Ambulatory Visit (INDEPENDENT_AMBULATORY_CARE_PROVIDER_SITE_OTHER): Payer: PRIVATE HEALTH INSURANCE | Admitting: *Deleted

## 2014-04-21 DIAGNOSIS — R9431 Abnormal electrocardiogram [ECG] [EKG]: Secondary | ICD-10-CM

## 2014-04-21 DIAGNOSIS — R079 Chest pain, unspecified: Secondary | ICD-10-CM

## 2014-04-21 DIAGNOSIS — Z01812 Encounter for preprocedural laboratory examination: Secondary | ICD-10-CM

## 2014-04-22 LAB — BASIC METABOLIC PANEL
BUN/Creatinine Ratio: 12 (ref 11–26)
BUN: 11 mg/dL (ref 8–27)
CO2: 27 mmol/L (ref 18–29)
Calcium: 8.6 mg/dL — ABNORMAL LOW (ref 8.7–10.3)
Chloride: 97 mmol/L (ref 97–108)
Creatinine, Ser: 0.94 mg/dL (ref 0.57–1.00)
GFR calc non Af Amer: 63 mL/min/{1.73_m2} (ref >59)
GFR, EST AFRICAN AMERICAN: 73 mL/min/{1.73_m2} (ref >59)
Glucose: 104 mg/dL — ABNORMAL HIGH (ref 65–99)
Potassium: 4.1 mmol/L (ref 3.5–5.2)
Sodium: 137 mmol/L (ref 134–144)

## 2014-05-12 ENCOUNTER — Encounter: Payer: Self-pay | Admitting: Cardiovascular Disease

## 2014-05-12 ENCOUNTER — Ambulatory Visit (INDEPENDENT_AMBULATORY_CARE_PROVIDER_SITE_OTHER): Payer: PRIVATE HEALTH INSURANCE | Admitting: Cardiovascular Disease

## 2014-05-12 VITALS — BP 132/60 | HR 67 | Ht 61.0 in | Wt 296.0 lb

## 2014-05-12 DIAGNOSIS — I5032 Chronic diastolic (congestive) heart failure: Secondary | ICD-10-CM

## 2014-05-12 DIAGNOSIS — I10 Essential (primary) hypertension: Secondary | ICD-10-CM

## 2014-05-12 DIAGNOSIS — R079 Chest pain, unspecified: Secondary | ICD-10-CM

## 2014-05-12 MED ORDER — FUROSEMIDE 40 MG PO TABS: 40.0000 mg | ORAL_TABLET | Freq: Two times a day (BID) | ORAL | Status: DC

## 2014-05-12 NOTE — Progress Notes (Signed)
Primary care physician: Dr. Ancil Boozer  HPI  This is a pleasant 68 year old female who is here today for followup visit regarding  chest pain and chronic diastolic heart failure . She has prolonged history of pulmonary sarcoidosis since 1994 which is being followed at [facility]. She has been on intermittent courses of prednisone.  Other medical conditions include GERD, morbid obesity, hyperlipidemia and sleep apnea. She was  evaluated for left-sided chest pain and exertional dyspnea.  She underwent a pharmacologic nuclear stress test which was very suboptimal due to attenuation artifact. It was suggestive of possible anterior and inferior ischemia with ejection fraction of 50%. Echocardiogram showed normal LV systolic function with ejection fraction of 60%, mild grade 1 diastolic dysfunction, mild tricuspid regurgitation and borderline pulmonary hypertension with systolic pulmonary pressure of 41 mm mercury. Cardiac catheterization showed minor luminal irregularities with no obstructive disease. LVEDP was 22. She continued to have dyspnea mostly at night as well as orthopnea. Thus, I increased the dose of Lasix during last visit. Followup basic metabolic profile was fine. She had no significant weight loss and continues to have the same symptoms. She tries to follow a low-sodium diet. She thinks that the CPAP machine might need adjustment.   Allergies  Allergen Reactions  . Bee Venom Shortness Of Breath    swelling  . Niaspan [Niacin Er] Other (See Comments)    Hot flashes  . Welchol [Colesevelam Hcl] Nausea And Vomiting    indigestion     Current Outpatient Prescriptions on File Prior to Visit  Medication Sig Dispense Refill  . acetaminophen (TYLENOL) 325 MG tablet Take 650 mg by mouth as needed.      Marland Kitchen albuterol (PROVENTIL HFA;VENTOLIN HFA) 108 (90 BASE) MCG/ACT inhaler Inhale into the lungs every 6 (six) hours as needed for wheezing or shortness of breath.      . allopurinol (ZYLOPRIM) 100 MG  tablet Take 100 mg by mouth daily.      Marland Kitchen ALPRAZolam (XANAX) 0.5 MG tablet Take 0.5 mg by mouth at bedtime as needed for anxiety.      Marland Kitchen amLODipine-olmesartan (AZOR) 5-20 MG per tablet Take 1 tablet by mouth daily.      Marland Kitchen aspirin 81 MG tablet Take 81 mg by mouth daily.      . cetirizine (ZYRTEC) 10 MG tablet Take 10 mg by mouth daily.      Marland Kitchen desonide (DESOWEN) 0.05 % lotion Apply topically 2 (two) times daily.      . Ergocalciferol (VITAMIN D2) 2000 UNITS TABS Take by mouth daily.      Marland Kitchen FLUoxetine (PROZAC) 40 MG capsule Take 40 mg by mouth daily.      . fluticasone (FLONASE) 50 MCG/ACT nasal spray Place 2 sprays into both nostrils as needed.       . furosemide (LASIX) 40 MG tablet Take 1 tablet in the morning & 1/2 tablet in the evening  135 tablet  3  . Gabapentin, PHN, (GRALISE) 300 MG TABS Take by mouth 3 (three) times daily.      . hydrOXYzine (ATARAX/VISTARIL) 10 MG tablet Take 10 mg by mouth as needed.      Marland Kitchen omeprazole (PRILOSEC) 20 MG capsule Take 20 mg by mouth daily.      . potassium chloride SA (K-DUR,KLOR-CON) 20 MEQ tablet Take 1 tablet (20 mEq total) by mouth daily.  90 tablet  3  . rosuvastatin (CRESTOR) 20 MG tablet Take 20 mg by mouth daily.      . solifenacin (VESICARE)  10 MG tablet Take 10 mg by mouth daily.       No current facility-administered medications on file prior to visit.     Past Medical History  Diagnosis Date  . Asthma   . Arthritis   . Gout   . Allergy   . Vertigo   . Sarcoidosis   . GERD (gastroesophageal reflux disease)   . Apnea     CPAP  . Anemia   . Hemorrhoid 2012  . Unspecified hypertrophic and atrophic condition of skin   . Other abnormal glucose   . Osteoarthrosis, unspecified whether generalized or localized, unspecified site   . Ingrowing nail   . Other abnormal blood chemistry   . Pain in joint, site unspecified   . Other seborrheic keratosis   . Unspecified hearing loss   . Allergic rhinitis, cause unspecified   . Unspecified  urinary incontinence   . Obstructive sleep apnea (adult) (pediatric)   . Headache(784.0)   . Morbid obesity   . Dizziness and giddiness   . Dysmetabolic syndrome X   . Carpal tunnel syndrome   . Anemia, unspecified   . Unspecified tinnitus   . Vertigo   . Migraine   . Heart murmur   . Hyperlipidemia   . Hypertension      Past Surgical History  Procedure Laterality Date  . Carpal tunnel release Bilateral   . Partial hysterectomy    . Inner ear surgery    . Back surgery      nerve compression  . Cataract extraction Bilateral   . Colonoscopy  2012  . Bladder suspension    . Brain stem surgery    . Cardiac catheterization      [facility]  . Cardiac catheterization  03/27/2014    [facility]     Family History  Problem Relation Age of Onset  . Hypertension Father      History   Social History  . Marital Status: Widowed    Spouse Name: N/A    Number of Children: N/A  . Years of Education: N/A   Occupational History  . Not on file.   Social History Main Topics  . Smoking status: Former Smoker -- 1.00 packs/day for 25 years    Types: Cigarettes  . Smokeless tobacco: Never Used  . Alcohol Use: No     Comment: occasional  . Drug Use: No  . Sexual Activity: Not on file   Other Topics Concern  . Not on file   Social History Narrative  . No narrative on file     ROS A 10 point review of system was performed. It is negative other than that mentioned in the history of present illness.   PHYSICAL EXAM   BP 132/60  Pulse 67  Ht 5\' 1"  (1.549 m)  Wt 296 lb (134.265 kg)  BMI 55.96 kg/m2 Constitutional: She is oriented to person, place, and time. She appears well-developed and well-nourished. No distress.  HENT: No nasal discharge.  Head: Normocephalic and atraumatic.  Eyes: Pupils are equal and round. No discharge.  Neck: Normal range of motion. Neck supple. No JVD present. No thyromegaly present.  Cardiovascular: Normal rate, regular rhythm, normal heart sounds.  Exam reveals no gallop and no friction rub. There is a 2/6 systolic ejection murmur at the pulmonic valve area. Pulmonary/Chest: Effort normal and breath sounds normal. No stridor. No respiratory distress. She has no wheezes. She has no rales. She exhibits no tenderness.  Abdominal: Soft. Bowel sounds are  normal. She exhibits no distension. There is no tenderness. There is no rebound and no guarding.  Musculoskeletal: Normal range of motion. She exhibits no edema and no tenderness.  Neurological: She is alert and oriented to person, place, and time. Coordination normal.  Skin: Skin is warm and dry. No rash noted. She is not diaphoretic. No erythema. No pallor.  Psychiatric: She has a normal mood and affect. Her behavior is normal. Judgment and thought content normal.    [id number]:[id number]  Rhythm  WITHIN NORMAL LIMITS   ASSESSMENT AND PLAN

## 2014-05-12 NOTE — Assessment & Plan Note (Signed)
Blood pressure is controlled on current medications. 

## 2014-05-12 NOTE — Patient Instructions (Signed)
Increase Furosemide (Lasix) to 40 mg twice daily.   Low sodium diet instructions.   Follow up in 3 months.

## 2014-05-12 NOTE — Assessment & Plan Note (Addendum)
The patient continues to have symptoms of nocturnal dyspnea and orthopnea. Dyspnea is likely multifactorial due to chronic diastolic heart failure, pulmonary disease and morbid obesity. I don't think she is following low-sodium diet accurately. We provided her with low-sodium diet instructions. I increased the dose of Lasix to 40 mg twice daily. Once volume status has improved, it might be worth to check the settings of her CPAP machine and see if adjustment is needed.

## 2014-05-26 ENCOUNTER — Emergency Department: Payer: Self-pay | Admitting: Emergency Medicine

## 2014-05-26 LAB — CBC
HCT: 37.1 % (ref 35.0–47.0)
HGB: 11.9 g/dL — ABNORMAL LOW (ref 12.0–16.0)
MCH: 28.2 pg (ref 26.0–34.0)
MCHC: 32.2 g/dL (ref 32.0–36.0)
MCV: 87 fL (ref 80–100)
Platelet: 268 10*3/uL (ref 150–440)
RBC: 4.24 10*6/uL (ref 3.80–5.20)
RDW: 14.5 % (ref 11.5–14.5)
WBC: 10.8 10*3/uL (ref 3.6–11.0)

## 2014-05-26 LAB — BASIC METABOLIC PANEL
Anion Gap: 4 — ABNORMAL LOW (ref 7–16)
BUN: 19 mg/dL — ABNORMAL HIGH (ref 7–18)
CO2: 31 mmol/L (ref 21–32)
Calcium, Total: 8.4 mg/dL — ABNORMAL LOW (ref 8.5–10.1)
Chloride: 102 mmol/L (ref 98–107)
Creatinine: 1.18 mg/dL (ref 0.60–1.30)
EGFR (Non-African Amer.): 48 — ABNORMAL LOW
GFR CALC AF AMER: 56 — AB
Glucose: 110 mg/dL — ABNORMAL HIGH (ref 65–99)
OSMOLALITY: 277 (ref 275–301)
Potassium: 3.9 mmol/L (ref 3.5–5.1)
Sodium: 137 mmol/L (ref 136–145)

## 2014-05-26 LAB — PRO B NATRIURETIC PEPTIDE: B-TYPE NATIURETIC PEPTID: 24 pg/mL (ref 0–125)

## 2014-05-26 LAB — TROPONIN I
Troponin-I: <0.02 ng/mL
Troponin-I: <0.02 ng/mL

## 2014-05-26 LAB — D-DIMER(ARMC): D-Dimer: 1169 ng/ml

## 2014-06-18 DIAGNOSIS — D862 Sarcoidosis of lung with sarcoidosis of lymph nodes: Secondary | ICD-10-CM | POA: Insufficient documentation

## 2014-07-02 DIAGNOSIS — I5189 Other ill-defined heart diseases: Secondary | ICD-10-CM | POA: Insufficient documentation

## 2014-07-03 DIAGNOSIS — R102 Pelvic and perineal pain: Secondary | ICD-10-CM | POA: Insufficient documentation

## 2014-07-03 DIAGNOSIS — N8110 Cystocele, unspecified: Secondary | ICD-10-CM | POA: Insufficient documentation

## 2014-07-03 DIAGNOSIS — N3281 Overactive bladder: Secondary | ICD-10-CM | POA: Insufficient documentation

## 2014-07-03 DIAGNOSIS — M62838 Other muscle spasm: Secondary | ICD-10-CM | POA: Insufficient documentation

## 2014-07-07 ENCOUNTER — Other Ambulatory Visit: Payer: Self-pay | Admitting: Cardiovascular Disease

## 2014-08-12 ENCOUNTER — Ambulatory Visit: Payer: PRIVATE HEALTH INSURANCE | Admitting: Cardiovascular Disease

## 2014-08-18 ENCOUNTER — Encounter: Payer: Self-pay | Admitting: Cardiovascular Disease

## 2014-09-19 DIAGNOSIS — M79643 Pain in unspecified hand: Secondary | ICD-10-CM | POA: Insufficient documentation

## 2014-10-21 DIAGNOSIS — M5440 Lumbago with sciatica, unspecified side: Secondary | ICD-10-CM | POA: Diagnosis not present

## 2014-10-22 DIAGNOSIS — H9190 Unspecified hearing loss, unspecified ear: Secondary | ICD-10-CM | POA: Diagnosis not present

## 2014-10-22 DIAGNOSIS — J42 Unspecified chronic bronchitis: Secondary | ICD-10-CM | POA: Diagnosis not present

## 2014-10-22 DIAGNOSIS — J45909 Unspecified asthma, uncomplicated: Secondary | ICD-10-CM | POA: Diagnosis not present

## 2014-10-22 DIAGNOSIS — R0600 Dyspnea, unspecified: Secondary | ICD-10-CM | POA: Diagnosis not present

## 2014-10-22 DIAGNOSIS — I5033 Acute on chronic diastolic (congestive) heart failure: Secondary | ICD-10-CM | POA: Diagnosis not present

## 2014-10-22 DIAGNOSIS — E780 Pure hypercholesterolemia: Secondary | ICD-10-CM | POA: Diagnosis not present

## 2014-10-22 DIAGNOSIS — R002 Palpitations: Secondary | ICD-10-CM | POA: Diagnosis not present

## 2014-10-22 DIAGNOSIS — G4730 Sleep apnea, unspecified: Secondary | ICD-10-CM | POA: Diagnosis not present

## 2014-10-22 DIAGNOSIS — I10 Essential (primary) hypertension: Secondary | ICD-10-CM | POA: Diagnosis not present

## 2014-10-22 DIAGNOSIS — Z7982 Long term (current) use of aspirin: Secondary | ICD-10-CM | POA: Diagnosis not present

## 2014-10-22 DIAGNOSIS — R0602 Shortness of breath: Secondary | ICD-10-CM | POA: Diagnosis not present

## 2014-10-22 DIAGNOSIS — G4733 Obstructive sleep apnea (adult) (pediatric): Secondary | ICD-10-CM | POA: Diagnosis not present

## 2014-10-22 DIAGNOSIS — Z7951 Long term (current) use of inhaled steroids: Secondary | ICD-10-CM | POA: Diagnosis not present

## 2014-10-22 DIAGNOSIS — D862 Sarcoidosis of lung with sarcoidosis of lymph nodes: Secondary | ICD-10-CM | POA: Diagnosis not present

## 2014-10-22 DIAGNOSIS — Z79899 Other long term (current) drug therapy: Secondary | ICD-10-CM | POA: Diagnosis not present

## 2014-10-27 DIAGNOSIS — Z79899 Other long term (current) drug therapy: Secondary | ICD-10-CM | POA: Diagnosis not present

## 2014-10-27 DIAGNOSIS — I5033 Acute on chronic diastolic (congestive) heart failure: Secondary | ICD-10-CM | POA: Diagnosis not present

## 2014-10-31 DIAGNOSIS — D862 Sarcoidosis of lung with sarcoidosis of lymph nodes: Secondary | ICD-10-CM | POA: Diagnosis not present

## 2014-10-31 DIAGNOSIS — G5602 Carpal tunnel syndrome, left upper limb: Secondary | ICD-10-CM | POA: Diagnosis not present

## 2014-10-31 DIAGNOSIS — R0602 Shortness of breath: Secondary | ICD-10-CM | POA: Diagnosis not present

## 2014-10-31 DIAGNOSIS — G4440 Drug-induced headache, not elsewhere classified, not intractable: Secondary | ICD-10-CM | POA: Diagnosis not present

## 2014-10-31 DIAGNOSIS — G44329 Chronic post-traumatic headache, not intractable: Secondary | ICD-10-CM | POA: Diagnosis not present

## 2014-10-31 DIAGNOSIS — G43109 Migraine with aura, not intractable, without status migrainosus: Secondary | ICD-10-CM | POA: Diagnosis not present

## 2014-10-31 DIAGNOSIS — G5601 Carpal tunnel syndrome, right upper limb: Secondary | ICD-10-CM | POA: Diagnosis not present

## 2014-10-31 DIAGNOSIS — K5900 Constipation, unspecified: Secondary | ICD-10-CM | POA: Diagnosis not present

## 2014-11-02 DIAGNOSIS — Z87891 Personal history of nicotine dependence: Secondary | ICD-10-CM | POA: Diagnosis not present

## 2014-11-02 DIAGNOSIS — E780 Pure hypercholesterolemia: Secondary | ICD-10-CM | POA: Diagnosis not present

## 2014-11-02 DIAGNOSIS — E875 Hyperkalemia: Secondary | ICD-10-CM | POA: Diagnosis not present

## 2014-11-02 DIAGNOSIS — R0602 Shortness of breath: Secondary | ICD-10-CM | POA: Diagnosis not present

## 2014-11-02 DIAGNOSIS — I5030 Unspecified diastolic (congestive) heart failure: Secondary | ICD-10-CM | POA: Diagnosis not present

## 2014-11-02 DIAGNOSIS — I10 Essential (primary) hypertension: Secondary | ICD-10-CM | POA: Diagnosis not present

## 2014-11-02 DIAGNOSIS — H9190 Unspecified hearing loss, unspecified ear: Secondary | ICD-10-CM | POA: Diagnosis not present

## 2014-11-02 DIAGNOSIS — Z79899 Other long term (current) drug therapy: Secondary | ICD-10-CM | POA: Diagnosis not present

## 2014-11-04 DIAGNOSIS — I519 Heart disease, unspecified: Secondary | ICD-10-CM | POA: Diagnosis not present

## 2014-11-04 DIAGNOSIS — M109 Gout, unspecified: Secondary | ICD-10-CM | POA: Diagnosis not present

## 2014-11-04 DIAGNOSIS — E875 Hyperkalemia: Secondary | ICD-10-CM | POA: Diagnosis not present

## 2014-11-04 DIAGNOSIS — H9190 Unspecified hearing loss, unspecified ear: Secondary | ICD-10-CM | POA: Diagnosis not present

## 2014-11-04 DIAGNOSIS — N3281 Overactive bladder: Secondary | ICD-10-CM | POA: Diagnosis not present

## 2014-11-04 DIAGNOSIS — N179 Acute kidney failure, unspecified: Secondary | ICD-10-CM | POA: Diagnosis not present

## 2014-11-04 DIAGNOSIS — R0601 Orthopnea: Secondary | ICD-10-CM | POA: Diagnosis not present

## 2014-11-04 DIAGNOSIS — I10 Essential (primary) hypertension: Secondary | ICD-10-CM | POA: Diagnosis not present

## 2014-11-04 DIAGNOSIS — D860 Sarcoidosis of lung: Secondary | ICD-10-CM | POA: Diagnosis not present

## 2014-11-04 DIAGNOSIS — G4730 Sleep apnea, unspecified: Secondary | ICD-10-CM | POA: Diagnosis not present

## 2014-11-04 DIAGNOSIS — Z79899 Other long term (current) drug therapy: Secondary | ICD-10-CM | POA: Diagnosis not present

## 2014-11-04 DIAGNOSIS — I5032 Chronic diastolic (congestive) heart failure: Secondary | ICD-10-CM | POA: Diagnosis not present

## 2014-11-04 DIAGNOSIS — M79606 Pain in leg, unspecified: Secondary | ICD-10-CM | POA: Diagnosis not present

## 2014-11-10 DIAGNOSIS — M5117 Intervertebral disc disorders with radiculopathy, lumbosacral region: Secondary | ICD-10-CM | POA: Diagnosis not present

## 2014-11-10 DIAGNOSIS — M4806 Spinal stenosis, lumbar region: Secondary | ICD-10-CM | POA: Diagnosis not present

## 2014-11-10 DIAGNOSIS — M47816 Spondylosis without myelopathy or radiculopathy, lumbar region: Secondary | ICD-10-CM | POA: Diagnosis not present

## 2014-11-10 DIAGNOSIS — M4726 Other spondylosis with radiculopathy, lumbar region: Secondary | ICD-10-CM | POA: Diagnosis not present

## 2014-11-10 DIAGNOSIS — M5440 Lumbago with sciatica, unspecified side: Secondary | ICD-10-CM | POA: Diagnosis not present

## 2014-11-10 DIAGNOSIS — M5416 Radiculopathy, lumbar region: Secondary | ICD-10-CM | POA: Diagnosis not present

## 2014-11-14 DIAGNOSIS — G5601 Carpal tunnel syndrome, right upper limb: Secondary | ICD-10-CM | POA: Diagnosis not present

## 2014-11-14 DIAGNOSIS — G5603 Carpal tunnel syndrome, bilateral upper limbs: Secondary | ICD-10-CM | POA: Insufficient documentation

## 2014-11-14 DIAGNOSIS — G5602 Carpal tunnel syndrome, left upper limb: Secondary | ICD-10-CM | POA: Diagnosis not present

## 2014-11-18 DIAGNOSIS — F329 Major depressive disorder, single episode, unspecified: Secondary | ICD-10-CM | POA: Diagnosis not present

## 2014-11-18 DIAGNOSIS — R42 Dizziness and giddiness: Secondary | ICD-10-CM | POA: Diagnosis not present

## 2014-11-18 DIAGNOSIS — M109 Gout, unspecified: Secondary | ICD-10-CM | POA: Diagnosis not present

## 2014-11-18 DIAGNOSIS — M47816 Spondylosis without myelopathy or radiculopathy, lumbar region: Secondary | ICD-10-CM | POA: Diagnosis not present

## 2014-11-18 DIAGNOSIS — H9190 Unspecified hearing loss, unspecified ear: Secondary | ICD-10-CM | POA: Diagnosis not present

## 2014-11-18 DIAGNOSIS — G4733 Obstructive sleep apnea (adult) (pediatric): Secondary | ICD-10-CM | POA: Diagnosis not present

## 2014-11-18 DIAGNOSIS — G5602 Carpal tunnel syndrome, left upper limb: Secondary | ICD-10-CM | POA: Diagnosis not present

## 2014-11-18 DIAGNOSIS — I10 Essential (primary) hypertension: Secondary | ICD-10-CM | POA: Diagnosis not present

## 2014-11-18 DIAGNOSIS — G5601 Carpal tunnel syndrome, right upper limb: Secondary | ICD-10-CM | POA: Diagnosis not present

## 2014-11-18 DIAGNOSIS — I5032 Chronic diastolic (congestive) heart failure: Secondary | ICD-10-CM | POA: Diagnosis not present

## 2014-11-18 DIAGNOSIS — M549 Dorsalgia, unspecified: Secondary | ICD-10-CM | POA: Diagnosis not present

## 2014-11-18 DIAGNOSIS — R102 Pelvic and perineal pain: Secondary | ICD-10-CM | POA: Diagnosis not present

## 2014-11-18 DIAGNOSIS — E875 Hyperkalemia: Secondary | ICD-10-CM | POA: Diagnosis not present

## 2014-11-21 DIAGNOSIS — R200 Anesthesia of skin: Secondary | ICD-10-CM | POA: Insufficient documentation

## 2014-11-21 DIAGNOSIS — G4730 Sleep apnea, unspecified: Secondary | ICD-10-CM | POA: Diagnosis not present

## 2014-11-21 DIAGNOSIS — G4733 Obstructive sleep apnea (adult) (pediatric): Secondary | ICD-10-CM | POA: Diagnosis not present

## 2014-11-25 DIAGNOSIS — D860 Sarcoidosis of lung: Secondary | ICD-10-CM | POA: Diagnosis not present

## 2014-11-25 DIAGNOSIS — M109 Gout, unspecified: Secondary | ICD-10-CM | POA: Diagnosis not present

## 2014-11-25 DIAGNOSIS — G8929 Other chronic pain: Secondary | ICD-10-CM | POA: Diagnosis not present

## 2014-11-25 DIAGNOSIS — G5601 Carpal tunnel syndrome, right upper limb: Secondary | ICD-10-CM | POA: Diagnosis not present

## 2014-11-25 DIAGNOSIS — B351 Tinea unguium: Secondary | ICD-10-CM | POA: Diagnosis not present

## 2014-11-25 DIAGNOSIS — H9190 Unspecified hearing loss, unspecified ear: Secondary | ICD-10-CM | POA: Diagnosis not present

## 2014-11-25 DIAGNOSIS — G5602 Carpal tunnel syndrome, left upper limb: Secondary | ICD-10-CM | POA: Diagnosis not present

## 2014-11-25 DIAGNOSIS — M7989 Other specified soft tissue disorders: Secondary | ICD-10-CM | POA: Diagnosis not present

## 2014-11-25 DIAGNOSIS — L03032 Cellulitis of left toe: Secondary | ICD-10-CM | POA: Diagnosis not present

## 2014-11-25 DIAGNOSIS — M79675 Pain in left toe(s): Secondary | ICD-10-CM | POA: Diagnosis not present

## 2014-11-25 DIAGNOSIS — I5032 Chronic diastolic (congestive) heart failure: Secondary | ICD-10-CM | POA: Diagnosis not present

## 2014-11-25 DIAGNOSIS — I10 Essential (primary) hypertension: Secondary | ICD-10-CM | POA: Diagnosis not present

## 2014-11-25 DIAGNOSIS — G4730 Sleep apnea, unspecified: Secondary | ICD-10-CM | POA: Diagnosis not present

## 2014-11-25 DIAGNOSIS — L600 Ingrowing nail: Secondary | ICD-10-CM | POA: Diagnosis not present

## 2014-12-01 DIAGNOSIS — I10 Essential (primary) hypertension: Secondary | ICD-10-CM | POA: Diagnosis not present

## 2014-12-02 DIAGNOSIS — I10 Essential (primary) hypertension: Secondary | ICD-10-CM | POA: Diagnosis not present

## 2014-12-03 DIAGNOSIS — I10 Essential (primary) hypertension: Secondary | ICD-10-CM | POA: Diagnosis not present

## 2014-12-04 DIAGNOSIS — I10 Essential (primary) hypertension: Secondary | ICD-10-CM | POA: Diagnosis not present

## 2014-12-05 DIAGNOSIS — I10 Essential (primary) hypertension: Secondary | ICD-10-CM | POA: Diagnosis not present

## 2014-12-06 DIAGNOSIS — I10 Essential (primary) hypertension: Secondary | ICD-10-CM | POA: Diagnosis not present

## 2014-12-07 DIAGNOSIS — I10 Essential (primary) hypertension: Secondary | ICD-10-CM | POA: Diagnosis not present

## 2014-12-08 DIAGNOSIS — J019 Acute sinusitis, unspecified: Secondary | ICD-10-CM | POA: Diagnosis not present

## 2014-12-08 DIAGNOSIS — I10 Essential (primary) hypertension: Secondary | ICD-10-CM | POA: Diagnosis not present

## 2014-12-09 DIAGNOSIS — I10 Essential (primary) hypertension: Secondary | ICD-10-CM | POA: Diagnosis not present

## 2014-12-10 DIAGNOSIS — I10 Essential (primary) hypertension: Secondary | ICD-10-CM | POA: Diagnosis not present

## 2014-12-11 DIAGNOSIS — I10 Essential (primary) hypertension: Secondary | ICD-10-CM | POA: Diagnosis not present

## 2014-12-12 DIAGNOSIS — I10 Essential (primary) hypertension: Secondary | ICD-10-CM | POA: Diagnosis not present

## 2014-12-13 DIAGNOSIS — I10 Essential (primary) hypertension: Secondary | ICD-10-CM | POA: Diagnosis not present

## 2014-12-14 DIAGNOSIS — I10 Essential (primary) hypertension: Secondary | ICD-10-CM | POA: Diagnosis not present

## 2014-12-15 DIAGNOSIS — I10 Essential (primary) hypertension: Secondary | ICD-10-CM | POA: Diagnosis not present

## 2014-12-16 DIAGNOSIS — R939 Diagnostic imaging inconclusive due to excess body fat of patient: Secondary | ICD-10-CM | POA: Diagnosis not present

## 2014-12-16 DIAGNOSIS — M75111 Incomplete rotator cuff tear or rupture of right shoulder, not specified as traumatic: Secondary | ICD-10-CM | POA: Diagnosis not present

## 2014-12-16 DIAGNOSIS — M7551 Bursitis of right shoulder: Secondary | ICD-10-CM | POA: Diagnosis not present

## 2014-12-16 DIAGNOSIS — M25511 Pain in right shoulder: Secondary | ICD-10-CM | POA: Diagnosis not present

## 2014-12-16 DIAGNOSIS — M19011 Primary osteoarthritis, right shoulder: Secondary | ICD-10-CM | POA: Diagnosis not present

## 2014-12-16 DIAGNOSIS — I10 Essential (primary) hypertension: Secondary | ICD-10-CM | POA: Diagnosis not present

## 2014-12-16 DIAGNOSIS — Z9119 Patient's noncompliance with other medical treatment and regimen: Secondary | ICD-10-CM | POA: Diagnosis not present

## 2014-12-17 DIAGNOSIS — I10 Essential (primary) hypertension: Secondary | ICD-10-CM | POA: Diagnosis not present

## 2014-12-18 DIAGNOSIS — I10 Essential (primary) hypertension: Secondary | ICD-10-CM | POA: Diagnosis not present

## 2014-12-19 DIAGNOSIS — I10 Essential (primary) hypertension: Secondary | ICD-10-CM | POA: Diagnosis not present

## 2014-12-20 DIAGNOSIS — I10 Essential (primary) hypertension: Secondary | ICD-10-CM | POA: Diagnosis not present

## 2014-12-21 DIAGNOSIS — I10 Essential (primary) hypertension: Secondary | ICD-10-CM | POA: Diagnosis not present

## 2014-12-22 DIAGNOSIS — G4733 Obstructive sleep apnea (adult) (pediatric): Secondary | ICD-10-CM | POA: Diagnosis not present

## 2014-12-22 DIAGNOSIS — G4730 Sleep apnea, unspecified: Secondary | ICD-10-CM | POA: Diagnosis not present

## 2014-12-22 DIAGNOSIS — I10 Essential (primary) hypertension: Secondary | ICD-10-CM | POA: Diagnosis not present

## 2014-12-23 DIAGNOSIS — D862 Sarcoidosis of lung with sarcoidosis of lymph nodes: Secondary | ICD-10-CM | POA: Diagnosis not present

## 2014-12-23 DIAGNOSIS — R200 Anesthesia of skin: Secondary | ICD-10-CM | POA: Diagnosis not present

## 2014-12-23 DIAGNOSIS — G4733 Obstructive sleep apnea (adult) (pediatric): Secondary | ICD-10-CM | POA: Diagnosis not present

## 2014-12-23 DIAGNOSIS — I10 Essential (primary) hypertension: Secondary | ICD-10-CM | POA: Diagnosis not present

## 2014-12-24 DIAGNOSIS — I10 Essential (primary) hypertension: Secondary | ICD-10-CM | POA: Diagnosis not present

## 2014-12-25 DIAGNOSIS — I10 Essential (primary) hypertension: Secondary | ICD-10-CM | POA: Diagnosis not present

## 2014-12-26 DIAGNOSIS — I10 Essential (primary) hypertension: Secondary | ICD-10-CM | POA: Diagnosis not present

## 2014-12-27 DIAGNOSIS — I5030 Unspecified diastolic (congestive) heart failure: Secondary | ICD-10-CM | POA: Diagnosis not present

## 2014-12-27 DIAGNOSIS — I509 Heart failure, unspecified: Secondary | ICD-10-CM | POA: Diagnosis not present

## 2014-12-27 DIAGNOSIS — F329 Major depressive disorder, single episode, unspecified: Secondary | ICD-10-CM | POA: Diagnosis not present

## 2014-12-27 DIAGNOSIS — E780 Pure hypercholesterolemia: Secondary | ICD-10-CM | POA: Diagnosis not present

## 2014-12-27 DIAGNOSIS — M7989 Other specified soft tissue disorders: Secondary | ICD-10-CM | POA: Diagnosis not present

## 2014-12-27 DIAGNOSIS — Z79899 Other long term (current) drug therapy: Secondary | ICD-10-CM | POA: Diagnosis not present

## 2014-12-27 DIAGNOSIS — N19 Unspecified kidney failure: Secondary | ICD-10-CM | POA: Diagnosis not present

## 2014-12-27 DIAGNOSIS — I517 Cardiomegaly: Secondary | ICD-10-CM | POA: Diagnosis not present

## 2014-12-27 DIAGNOSIS — D869 Sarcoidosis, unspecified: Secondary | ICD-10-CM | POA: Diagnosis not present

## 2014-12-27 DIAGNOSIS — I10 Essential (primary) hypertension: Secondary | ICD-10-CM | POA: Diagnosis not present

## 2014-12-27 DIAGNOSIS — R600 Localized edema: Secondary | ICD-10-CM | POA: Diagnosis not present

## 2014-12-27 DIAGNOSIS — Z87891 Personal history of nicotine dependence: Secondary | ICD-10-CM | POA: Diagnosis not present

## 2014-12-27 DIAGNOSIS — I499 Cardiac arrhythmia, unspecified: Secondary | ICD-10-CM | POA: Diagnosis not present

## 2014-12-27 DIAGNOSIS — M25559 Pain in unspecified hip: Secondary | ICD-10-CM | POA: Diagnosis not present

## 2014-12-27 DIAGNOSIS — M1990 Unspecified osteoarthritis, unspecified site: Secondary | ICD-10-CM | POA: Diagnosis not present

## 2014-12-27 DIAGNOSIS — H9190 Unspecified hearing loss, unspecified ear: Secondary | ICD-10-CM | POA: Diagnosis not present

## 2014-12-27 DIAGNOSIS — G4730 Sleep apnea, unspecified: Secondary | ICD-10-CM | POA: Diagnosis not present

## 2014-12-27 DIAGNOSIS — R7989 Other specified abnormal findings of blood chemistry: Secondary | ICD-10-CM | POA: Diagnosis not present

## 2014-12-27 DIAGNOSIS — Z7982 Long term (current) use of aspirin: Secondary | ICD-10-CM | POA: Diagnosis not present

## 2014-12-28 DIAGNOSIS — I10 Essential (primary) hypertension: Secondary | ICD-10-CM | POA: Diagnosis not present

## 2014-12-29 DIAGNOSIS — I10 Essential (primary) hypertension: Secondary | ICD-10-CM | POA: Diagnosis not present

## 2014-12-30 DIAGNOSIS — I10 Essential (primary) hypertension: Secondary | ICD-10-CM | POA: Diagnosis not present

## 2014-12-31 DIAGNOSIS — I10 Essential (primary) hypertension: Secondary | ICD-10-CM | POA: Diagnosis not present

## 2014-12-31 DIAGNOSIS — I5032 Chronic diastolic (congestive) heart failure: Secondary | ICD-10-CM | POA: Diagnosis not present

## 2014-12-31 DIAGNOSIS — I25119 Atherosclerotic heart disease of native coronary artery with unspecified angina pectoris: Secondary | ICD-10-CM | POA: Diagnosis not present

## 2015-01-01 DIAGNOSIS — I10 Essential (primary) hypertension: Secondary | ICD-10-CM | POA: Diagnosis not present

## 2015-01-02 DIAGNOSIS — I10 Essential (primary) hypertension: Secondary | ICD-10-CM | POA: Diagnosis not present

## 2015-01-03 DIAGNOSIS — I10 Essential (primary) hypertension: Secondary | ICD-10-CM | POA: Diagnosis not present

## 2015-01-04 DIAGNOSIS — I10 Essential (primary) hypertension: Secondary | ICD-10-CM | POA: Diagnosis not present

## 2015-01-05 DIAGNOSIS — G4733 Obstructive sleep apnea (adult) (pediatric): Secondary | ICD-10-CM | POA: Diagnosis not present

## 2015-01-05 DIAGNOSIS — I5032 Chronic diastolic (congestive) heart failure: Secondary | ICD-10-CM | POA: Diagnosis not present

## 2015-01-05 DIAGNOSIS — E780 Pure hypercholesterolemia: Secondary | ICD-10-CM | POA: Diagnosis not present

## 2015-01-05 DIAGNOSIS — I10 Essential (primary) hypertension: Secondary | ICD-10-CM | POA: Diagnosis not present

## 2015-01-06 DIAGNOSIS — G5602 Carpal tunnel syndrome, left upper limb: Secondary | ICD-10-CM | POA: Diagnosis not present

## 2015-01-06 DIAGNOSIS — L600 Ingrowing nail: Secondary | ICD-10-CM | POA: Diagnosis not present

## 2015-01-06 DIAGNOSIS — M79675 Pain in left toe(s): Secondary | ICD-10-CM | POA: Diagnosis not present

## 2015-01-06 DIAGNOSIS — R42 Dizziness and giddiness: Secondary | ICD-10-CM | POA: Diagnosis not present

## 2015-01-06 DIAGNOSIS — R102 Pelvic and perineal pain: Secondary | ICD-10-CM | POA: Diagnosis not present

## 2015-01-06 DIAGNOSIS — D860 Sarcoidosis of lung: Secondary | ICD-10-CM | POA: Diagnosis not present

## 2015-01-06 DIAGNOSIS — G4730 Sleep apnea, unspecified: Secondary | ICD-10-CM | POA: Diagnosis not present

## 2015-01-06 DIAGNOSIS — G5601 Carpal tunnel syndrome, right upper limb: Secondary | ICD-10-CM | POA: Diagnosis not present

## 2015-01-06 DIAGNOSIS — B351 Tinea unguium: Secondary | ICD-10-CM | POA: Diagnosis not present

## 2015-01-06 DIAGNOSIS — F329 Major depressive disorder, single episode, unspecified: Secondary | ICD-10-CM | POA: Diagnosis not present

## 2015-01-06 DIAGNOSIS — N3281 Overactive bladder: Secondary | ICD-10-CM | POA: Diagnosis not present

## 2015-01-06 DIAGNOSIS — H9190 Unspecified hearing loss, unspecified ear: Secondary | ICD-10-CM | POA: Diagnosis not present

## 2015-01-06 DIAGNOSIS — I10 Essential (primary) hypertension: Secondary | ICD-10-CM | POA: Diagnosis not present

## 2015-01-06 DIAGNOSIS — M109 Gout, unspecified: Secondary | ICD-10-CM | POA: Diagnosis not present

## 2015-01-06 DIAGNOSIS — E780 Pure hypercholesterolemia: Secondary | ICD-10-CM | POA: Diagnosis not present

## 2015-01-06 DIAGNOSIS — I5032 Chronic diastolic (congestive) heart failure: Secondary | ICD-10-CM | POA: Diagnosis not present

## 2015-01-07 DIAGNOSIS — I10 Essential (primary) hypertension: Secondary | ICD-10-CM | POA: Diagnosis not present

## 2015-01-08 DIAGNOSIS — I10 Essential (primary) hypertension: Secondary | ICD-10-CM | POA: Diagnosis not present

## 2015-01-09 DIAGNOSIS — I10 Essential (primary) hypertension: Secondary | ICD-10-CM | POA: Diagnosis not present

## 2015-01-10 DIAGNOSIS — I10 Essential (primary) hypertension: Secondary | ICD-10-CM | POA: Diagnosis not present

## 2015-01-11 DIAGNOSIS — I10 Essential (primary) hypertension: Secondary | ICD-10-CM | POA: Diagnosis not present

## 2015-01-12 DIAGNOSIS — I10 Essential (primary) hypertension: Secondary | ICD-10-CM | POA: Diagnosis not present

## 2015-01-13 DIAGNOSIS — I10 Essential (primary) hypertension: Secondary | ICD-10-CM | POA: Diagnosis not present

## 2015-01-14 DIAGNOSIS — D862 Sarcoidosis of lung with sarcoidosis of lymph nodes: Secondary | ICD-10-CM | POA: Diagnosis not present

## 2015-01-14 DIAGNOSIS — I5032 Chronic diastolic (congestive) heart failure: Secondary | ICD-10-CM | POA: Diagnosis not present

## 2015-01-14 DIAGNOSIS — R0602 Shortness of breath: Secondary | ICD-10-CM | POA: Diagnosis not present

## 2015-01-14 DIAGNOSIS — Z0000 Encounter for general adult medical examination without abnormal findings: Secondary | ICD-10-CM | POA: Diagnosis not present

## 2015-01-14 DIAGNOSIS — I25119 Atherosclerotic heart disease of native coronary artery with unspecified angina pectoris: Secondary | ICD-10-CM | POA: Diagnosis not present

## 2015-01-14 DIAGNOSIS — G4733 Obstructive sleep apnea (adult) (pediatric): Secondary | ICD-10-CM | POA: Diagnosis not present

## 2015-01-14 DIAGNOSIS — I10 Essential (primary) hypertension: Secondary | ICD-10-CM | POA: Diagnosis not present

## 2015-01-15 DIAGNOSIS — I10 Essential (primary) hypertension: Secondary | ICD-10-CM | POA: Diagnosis not present

## 2015-01-16 DIAGNOSIS — I10 Essential (primary) hypertension: Secondary | ICD-10-CM | POA: Diagnosis not present

## 2015-01-17 DIAGNOSIS — I10 Essential (primary) hypertension: Secondary | ICD-10-CM | POA: Diagnosis not present

## 2015-01-18 DIAGNOSIS — I10 Essential (primary) hypertension: Secondary | ICD-10-CM | POA: Diagnosis not present

## 2015-01-19 DIAGNOSIS — I10 Essential (primary) hypertension: Secondary | ICD-10-CM | POA: Diagnosis not present

## 2015-01-20 DIAGNOSIS — I10 Essential (primary) hypertension: Secondary | ICD-10-CM | POA: Diagnosis not present

## 2015-01-21 DIAGNOSIS — G4730 Sleep apnea, unspecified: Secondary | ICD-10-CM | POA: Diagnosis not present

## 2015-01-21 DIAGNOSIS — G4733 Obstructive sleep apnea (adult) (pediatric): Secondary | ICD-10-CM | POA: Diagnosis not present

## 2015-01-21 DIAGNOSIS — I10 Essential (primary) hypertension: Secondary | ICD-10-CM | POA: Diagnosis not present

## 2015-01-22 DIAGNOSIS — I10 Essential (primary) hypertension: Secondary | ICD-10-CM | POA: Diagnosis not present

## 2015-01-23 DIAGNOSIS — I10 Essential (primary) hypertension: Secondary | ICD-10-CM | POA: Diagnosis not present

## 2015-01-24 DIAGNOSIS — I10 Essential (primary) hypertension: Secondary | ICD-10-CM | POA: Diagnosis not present

## 2015-01-25 DIAGNOSIS — I10 Essential (primary) hypertension: Secondary | ICD-10-CM | POA: Diagnosis not present

## 2015-01-26 DIAGNOSIS — G5601 Carpal tunnel syndrome, right upper limb: Secondary | ICD-10-CM | POA: Diagnosis not present

## 2015-01-26 DIAGNOSIS — I10 Essential (primary) hypertension: Secondary | ICD-10-CM | POA: Diagnosis not present

## 2015-01-26 DIAGNOSIS — G5602 Carpal tunnel syndrome, left upper limb: Secondary | ICD-10-CM | POA: Diagnosis not present

## 2015-01-27 DIAGNOSIS — I10 Essential (primary) hypertension: Secondary | ICD-10-CM | POA: Diagnosis not present

## 2015-01-28 DIAGNOSIS — I10 Essential (primary) hypertension: Secondary | ICD-10-CM | POA: Diagnosis not present

## 2015-01-29 DIAGNOSIS — I10 Essential (primary) hypertension: Secondary | ICD-10-CM | POA: Diagnosis not present

## 2015-01-30 DIAGNOSIS — I10 Essential (primary) hypertension: Secondary | ICD-10-CM | POA: Diagnosis not present

## 2015-01-31 DIAGNOSIS — I10 Essential (primary) hypertension: Secondary | ICD-10-CM | POA: Diagnosis not present

## 2015-02-01 DIAGNOSIS — I10 Essential (primary) hypertension: Secondary | ICD-10-CM | POA: Diagnosis not present

## 2015-02-02 DIAGNOSIS — I10 Essential (primary) hypertension: Secondary | ICD-10-CM | POA: Diagnosis not present

## 2015-02-03 DIAGNOSIS — I10 Essential (primary) hypertension: Secondary | ICD-10-CM | POA: Diagnosis not present

## 2015-02-04 DIAGNOSIS — G932 Benign intracranial hypertension: Secondary | ICD-10-CM | POA: Diagnosis not present

## 2015-02-04 DIAGNOSIS — R609 Edema, unspecified: Secondary | ICD-10-CM | POA: Diagnosis not present

## 2015-02-04 DIAGNOSIS — Z1159 Encounter for screening for other viral diseases: Secondary | ICD-10-CM | POA: Diagnosis not present

## 2015-02-04 DIAGNOSIS — I2510 Atherosclerotic heart disease of native coronary artery without angina pectoris: Secondary | ICD-10-CM | POA: Diagnosis not present

## 2015-02-04 DIAGNOSIS — G44329 Chronic post-traumatic headache, not intractable: Secondary | ICD-10-CM | POA: Diagnosis not present

## 2015-02-04 DIAGNOSIS — Z90710 Acquired absence of both cervix and uterus: Secondary | ICD-10-CM | POA: Diagnosis not present

## 2015-02-04 DIAGNOSIS — Z124 Encounter for screening for malignant neoplasm of cervix: Secondary | ICD-10-CM | POA: Diagnosis not present

## 2015-02-04 DIAGNOSIS — Z0000 Encounter for general adult medical examination without abnormal findings: Secondary | ICD-10-CM | POA: Diagnosis not present

## 2015-02-04 DIAGNOSIS — G43109 Migraine with aura, not intractable, without status migrainosus: Secondary | ICD-10-CM | POA: Diagnosis not present

## 2015-02-04 DIAGNOSIS — I10 Essential (primary) hypertension: Secondary | ICD-10-CM | POA: Diagnosis not present

## 2015-02-04 DIAGNOSIS — M549 Dorsalgia, unspecified: Secondary | ICD-10-CM | POA: Diagnosis not present

## 2015-02-04 DIAGNOSIS — R32 Unspecified urinary incontinence: Secondary | ICD-10-CM | POA: Diagnosis not present

## 2015-02-04 DIAGNOSIS — K5909 Other constipation: Secondary | ICD-10-CM | POA: Diagnosis not present

## 2015-02-04 DIAGNOSIS — D8689 Sarcoidosis of other sites: Secondary | ICD-10-CM | POA: Diagnosis not present

## 2015-02-05 DIAGNOSIS — I10 Essential (primary) hypertension: Secondary | ICD-10-CM | POA: Diagnosis not present

## 2015-02-06 DIAGNOSIS — I10 Essential (primary) hypertension: Secondary | ICD-10-CM | POA: Diagnosis not present

## 2015-02-07 DIAGNOSIS — I10 Essential (primary) hypertension: Secondary | ICD-10-CM | POA: Diagnosis not present

## 2015-02-08 DIAGNOSIS — I10 Essential (primary) hypertension: Secondary | ICD-10-CM | POA: Diagnosis not present

## 2015-02-09 DIAGNOSIS — I10 Essential (primary) hypertension: Secondary | ICD-10-CM | POA: Diagnosis not present

## 2015-02-10 DIAGNOSIS — I10 Essential (primary) hypertension: Secondary | ICD-10-CM | POA: Diagnosis not present

## 2015-02-11 DIAGNOSIS — R200 Anesthesia of skin: Secondary | ICD-10-CM | POA: Diagnosis not present

## 2015-02-11 DIAGNOSIS — I10 Essential (primary) hypertension: Secondary | ICD-10-CM | POA: Diagnosis not present

## 2015-02-11 DIAGNOSIS — Z1231 Encounter for screening mammogram for malignant neoplasm of breast: Secondary | ICD-10-CM | POA: Diagnosis not present

## 2015-02-11 DIAGNOSIS — G4733 Obstructive sleep apnea (adult) (pediatric): Secondary | ICD-10-CM | POA: Diagnosis not present

## 2015-02-11 DIAGNOSIS — N3281 Overactive bladder: Secondary | ICD-10-CM | POA: Diagnosis not present

## 2015-02-12 DIAGNOSIS — I10 Essential (primary) hypertension: Secondary | ICD-10-CM | POA: Diagnosis not present

## 2015-02-13 DIAGNOSIS — I10 Essential (primary) hypertension: Secondary | ICD-10-CM | POA: Diagnosis not present

## 2015-02-14 DIAGNOSIS — I10 Essential (primary) hypertension: Secondary | ICD-10-CM | POA: Diagnosis not present

## 2015-02-15 DIAGNOSIS — I10 Essential (primary) hypertension: Secondary | ICD-10-CM | POA: Diagnosis not present

## 2015-02-16 DIAGNOSIS — I10 Essential (primary) hypertension: Secondary | ICD-10-CM | POA: Diagnosis not present

## 2015-02-16 DIAGNOSIS — E2839 Other primary ovarian failure: Secondary | ICD-10-CM | POA: Diagnosis not present

## 2015-02-17 DIAGNOSIS — I10 Essential (primary) hypertension: Secondary | ICD-10-CM | POA: Diagnosis not present

## 2015-02-18 DIAGNOSIS — I10 Essential (primary) hypertension: Secondary | ICD-10-CM | POA: Diagnosis not present

## 2015-02-19 DIAGNOSIS — Z6841 Body Mass Index (BMI) 40.0 and over, adult: Secondary | ICD-10-CM | POA: Diagnosis not present

## 2015-02-19 DIAGNOSIS — Z79899 Other long term (current) drug therapy: Secondary | ICD-10-CM | POA: Diagnosis not present

## 2015-02-19 DIAGNOSIS — G4733 Obstructive sleep apnea (adult) (pediatric): Secondary | ICD-10-CM | POA: Diagnosis not present

## 2015-02-19 DIAGNOSIS — E662 Morbid (severe) obesity with alveolar hypoventilation: Secondary | ICD-10-CM | POA: Diagnosis not present

## 2015-02-19 DIAGNOSIS — I10 Essential (primary) hypertension: Secondary | ICD-10-CM | POA: Diagnosis not present

## 2015-02-19 DIAGNOSIS — I5032 Chronic diastolic (congestive) heart failure: Secondary | ICD-10-CM | POA: Diagnosis not present

## 2015-02-20 DIAGNOSIS — G4730 Sleep apnea, unspecified: Secondary | ICD-10-CM | POA: Diagnosis not present

## 2015-02-20 DIAGNOSIS — I10 Essential (primary) hypertension: Secondary | ICD-10-CM | POA: Diagnosis not present

## 2015-02-20 DIAGNOSIS — G4733 Obstructive sleep apnea (adult) (pediatric): Secondary | ICD-10-CM | POA: Diagnosis not present

## 2015-02-21 DIAGNOSIS — I10 Essential (primary) hypertension: Secondary | ICD-10-CM | POA: Diagnosis not present

## 2015-02-22 DIAGNOSIS — I10 Essential (primary) hypertension: Secondary | ICD-10-CM | POA: Diagnosis not present

## 2015-02-23 DIAGNOSIS — I10 Essential (primary) hypertension: Secondary | ICD-10-CM | POA: Diagnosis not present

## 2015-02-24 DIAGNOSIS — I10 Essential (primary) hypertension: Secondary | ICD-10-CM | POA: Diagnosis not present

## 2015-02-25 DIAGNOSIS — I10 Essential (primary) hypertension: Secondary | ICD-10-CM | POA: Diagnosis not present

## 2015-02-26 DIAGNOSIS — I10 Essential (primary) hypertension: Secondary | ICD-10-CM | POA: Diagnosis not present

## 2015-02-27 DIAGNOSIS — I10 Essential (primary) hypertension: Secondary | ICD-10-CM | POA: Diagnosis not present

## 2015-02-28 DIAGNOSIS — I10 Essential (primary) hypertension: Secondary | ICD-10-CM | POA: Diagnosis not present

## 2015-03-01 DIAGNOSIS — I10 Essential (primary) hypertension: Secondary | ICD-10-CM | POA: Diagnosis not present

## 2015-03-02 DIAGNOSIS — I10 Essential (primary) hypertension: Secondary | ICD-10-CM | POA: Diagnosis not present

## 2015-03-03 DIAGNOSIS — I10 Essential (primary) hypertension: Secondary | ICD-10-CM | POA: Diagnosis not present

## 2015-03-04 DIAGNOSIS — I10 Essential (primary) hypertension: Secondary | ICD-10-CM | POA: Diagnosis not present

## 2015-03-05 DIAGNOSIS — I10 Essential (primary) hypertension: Secondary | ICD-10-CM | POA: Diagnosis not present

## 2015-03-06 DIAGNOSIS — I10 Essential (primary) hypertension: Secondary | ICD-10-CM | POA: Diagnosis not present

## 2015-03-07 DIAGNOSIS — I10 Essential (primary) hypertension: Secondary | ICD-10-CM | POA: Diagnosis not present

## 2015-03-08 DIAGNOSIS — I10 Essential (primary) hypertension: Secondary | ICD-10-CM | POA: Diagnosis not present

## 2015-03-09 DIAGNOSIS — I10 Essential (primary) hypertension: Secondary | ICD-10-CM | POA: Diagnosis not present

## 2015-03-10 DIAGNOSIS — I5030 Unspecified diastolic (congestive) heart failure: Secondary | ICD-10-CM | POA: Diagnosis not present

## 2015-03-10 DIAGNOSIS — K635 Polyp of colon: Secondary | ICD-10-CM | POA: Diagnosis not present

## 2015-03-10 DIAGNOSIS — Z79899 Other long term (current) drug therapy: Secondary | ICD-10-CM | POA: Diagnosis not present

## 2015-03-10 DIAGNOSIS — D125 Benign neoplasm of sigmoid colon: Secondary | ICD-10-CM | POA: Diagnosis not present

## 2015-03-10 DIAGNOSIS — H9191 Unspecified hearing loss, right ear: Secondary | ICD-10-CM | POA: Diagnosis not present

## 2015-03-10 DIAGNOSIS — E669 Obesity, unspecified: Secondary | ICD-10-CM | POA: Diagnosis not present

## 2015-03-10 DIAGNOSIS — D128 Benign neoplasm of rectum: Secondary | ICD-10-CM | POA: Diagnosis not present

## 2015-03-10 DIAGNOSIS — K621 Rectal polyp: Secondary | ICD-10-CM | POA: Diagnosis not present

## 2015-03-10 DIAGNOSIS — Z1211 Encounter for screening for malignant neoplasm of colon: Secondary | ICD-10-CM | POA: Diagnosis not present

## 2015-03-10 DIAGNOSIS — D369 Benign neoplasm, unspecified site: Secondary | ICD-10-CM | POA: Diagnosis not present

## 2015-03-10 DIAGNOSIS — Z888 Allergy status to other drugs, medicaments and biological substances status: Secondary | ICD-10-CM | POA: Diagnosis not present

## 2015-03-10 DIAGNOSIS — I10 Essential (primary) hypertension: Secondary | ICD-10-CM | POA: Diagnosis not present

## 2015-03-10 DIAGNOSIS — F329 Major depressive disorder, single episode, unspecified: Secondary | ICD-10-CM | POA: Diagnosis not present

## 2015-03-10 DIAGNOSIS — M1990 Unspecified osteoarthritis, unspecified site: Secondary | ICD-10-CM | POA: Diagnosis not present

## 2015-03-10 DIAGNOSIS — E780 Pure hypercholesterolemia: Secondary | ICD-10-CM | POA: Diagnosis not present

## 2015-03-10 DIAGNOSIS — Z7982 Long term (current) use of aspirin: Secondary | ICD-10-CM | POA: Diagnosis not present

## 2015-03-10 DIAGNOSIS — Z6841 Body Mass Index (BMI) 40.0 and over, adult: Secondary | ICD-10-CM | POA: Diagnosis not present

## 2015-03-11 DIAGNOSIS — I10 Essential (primary) hypertension: Secondary | ICD-10-CM | POA: Diagnosis not present

## 2015-03-12 DIAGNOSIS — I10 Essential (primary) hypertension: Secondary | ICD-10-CM | POA: Diagnosis not present

## 2015-03-13 DIAGNOSIS — I10 Essential (primary) hypertension: Secondary | ICD-10-CM | POA: Diagnosis not present

## 2015-03-14 DIAGNOSIS — I10 Essential (primary) hypertension: Secondary | ICD-10-CM | POA: Diagnosis not present

## 2015-03-15 DIAGNOSIS — I10 Essential (primary) hypertension: Secondary | ICD-10-CM | POA: Diagnosis not present

## 2015-03-16 DIAGNOSIS — I10 Essential (primary) hypertension: Secondary | ICD-10-CM | POA: Diagnosis not present

## 2015-03-17 DIAGNOSIS — I10 Essential (primary) hypertension: Secondary | ICD-10-CM | POA: Diagnosis not present

## 2015-03-18 DIAGNOSIS — I10 Essential (primary) hypertension: Secondary | ICD-10-CM | POA: Diagnosis not present

## 2015-03-19 DIAGNOSIS — I10 Essential (primary) hypertension: Secondary | ICD-10-CM | POA: Diagnosis not present

## 2015-03-20 DIAGNOSIS — I10 Essential (primary) hypertension: Secondary | ICD-10-CM | POA: Diagnosis not present

## 2015-03-21 DIAGNOSIS — I10 Essential (primary) hypertension: Secondary | ICD-10-CM | POA: Diagnosis not present

## 2015-03-22 DIAGNOSIS — I10 Essential (primary) hypertension: Secondary | ICD-10-CM | POA: Diagnosis not present

## 2015-03-23 DIAGNOSIS — G4733 Obstructive sleep apnea (adult) (pediatric): Secondary | ICD-10-CM | POA: Diagnosis not present

## 2015-03-23 DIAGNOSIS — I10 Essential (primary) hypertension: Secondary | ICD-10-CM | POA: Diagnosis not present

## 2015-03-24 ENCOUNTER — Other Ambulatory Visit: Payer: Self-pay | Admitting: Family Medicine

## 2015-03-24 DIAGNOSIS — G4733 Obstructive sleep apnea (adult) (pediatric): Secondary | ICD-10-CM | POA: Diagnosis not present

## 2015-03-24 DIAGNOSIS — I10 Essential (primary) hypertension: Secondary | ICD-10-CM | POA: Diagnosis not present

## 2015-03-24 NOTE — Telephone Encounter (Signed)
Please refill medication  

## 2015-03-25 DIAGNOSIS — I10 Essential (primary) hypertension: Secondary | ICD-10-CM | POA: Diagnosis not present

## 2015-03-26 DIAGNOSIS — I10 Essential (primary) hypertension: Secondary | ICD-10-CM | POA: Diagnosis not present

## 2015-03-27 DIAGNOSIS — I10 Essential (primary) hypertension: Secondary | ICD-10-CM | POA: Diagnosis not present

## 2015-03-28 DIAGNOSIS — I10 Essential (primary) hypertension: Secondary | ICD-10-CM | POA: Diagnosis not present

## 2015-03-29 DIAGNOSIS — I10 Essential (primary) hypertension: Secondary | ICD-10-CM | POA: Diagnosis not present

## 2015-03-30 DIAGNOSIS — I10 Essential (primary) hypertension: Secondary | ICD-10-CM | POA: Diagnosis not present

## 2015-03-31 DIAGNOSIS — I10 Essential (primary) hypertension: Secondary | ICD-10-CM | POA: Diagnosis not present

## 2015-04-01 DIAGNOSIS — I10 Essential (primary) hypertension: Secondary | ICD-10-CM | POA: Diagnosis not present

## 2015-04-02 DIAGNOSIS — I10 Essential (primary) hypertension: Secondary | ICD-10-CM | POA: Diagnosis not present

## 2015-04-03 DIAGNOSIS — I10 Essential (primary) hypertension: Secondary | ICD-10-CM | POA: Diagnosis not present

## 2015-04-04 DIAGNOSIS — I10 Essential (primary) hypertension: Secondary | ICD-10-CM | POA: Diagnosis not present

## 2015-04-05 DIAGNOSIS — I10 Essential (primary) hypertension: Secondary | ICD-10-CM | POA: Diagnosis not present

## 2015-04-17 DIAGNOSIS — I5032 Chronic diastolic (congestive) heart failure: Secondary | ICD-10-CM | POA: Diagnosis not present

## 2015-04-17 DIAGNOSIS — I10 Essential (primary) hypertension: Secondary | ICD-10-CM | POA: Diagnosis not present

## 2015-04-22 DIAGNOSIS — G4733 Obstructive sleep apnea (adult) (pediatric): Secondary | ICD-10-CM | POA: Diagnosis not present

## 2015-04-23 DIAGNOSIS — G4733 Obstructive sleep apnea (adult) (pediatric): Secondary | ICD-10-CM | POA: Diagnosis not present

## 2015-05-13 DIAGNOSIS — G4733 Obstructive sleep apnea (adult) (pediatric): Secondary | ICD-10-CM | POA: Diagnosis not present

## 2015-05-13 DIAGNOSIS — M62838 Other muscle spasm: Secondary | ICD-10-CM | POA: Diagnosis not present

## 2015-05-13 DIAGNOSIS — D862 Sarcoidosis of lung with sarcoidosis of lymph nodes: Secondary | ICD-10-CM | POA: Diagnosis not present

## 2015-05-13 DIAGNOSIS — K5900 Constipation, unspecified: Secondary | ICD-10-CM | POA: Diagnosis not present

## 2015-05-13 DIAGNOSIS — N3281 Overactive bladder: Secondary | ICD-10-CM | POA: Diagnosis not present

## 2015-05-13 DIAGNOSIS — N393 Stress incontinence (female) (male): Secondary | ICD-10-CM | POA: Diagnosis not present

## 2015-05-13 DIAGNOSIS — I10 Essential (primary) hypertension: Secondary | ICD-10-CM | POA: Diagnosis not present

## 2015-05-23 DIAGNOSIS — G4733 Obstructive sleep apnea (adult) (pediatric): Secondary | ICD-10-CM | POA: Diagnosis not present

## 2015-06-16 DIAGNOSIS — D869 Sarcoidosis, unspecified: Secondary | ICD-10-CM | POA: Diagnosis not present

## 2015-06-16 DIAGNOSIS — H538 Other visual disturbances: Secondary | ICD-10-CM | POA: Diagnosis not present

## 2015-06-16 DIAGNOSIS — H04123 Dry eye syndrome of bilateral lacrimal glands: Secondary | ICD-10-CM | POA: Diagnosis not present

## 2015-06-16 DIAGNOSIS — Z0100 Encounter for examination of eyes and vision without abnormal findings: Secondary | ICD-10-CM | POA: Diagnosis not present

## 2015-06-16 DIAGNOSIS — H43813 Vitreous degeneration, bilateral: Secondary | ICD-10-CM | POA: Diagnosis not present

## 2015-06-17 DIAGNOSIS — G4733 Obstructive sleep apnea (adult) (pediatric): Secondary | ICD-10-CM | POA: Diagnosis not present

## 2015-08-24 ENCOUNTER — Other Ambulatory Visit: Payer: Self-pay

## 2015-08-25 ENCOUNTER — Other Ambulatory Visit: Payer: Self-pay

## 2015-08-25 NOTE — Telephone Encounter (Signed)
Sent to dr. Ancil Boozer

## 2015-08-26 NOTE — Telephone Encounter (Signed)
Please schedule patient appointment Dr. Ancil Boozer refused med refills until she is seen

## 2015-08-26 NOTE — Telephone Encounter (Signed)
Pharmacy sent it to the wrong place. They should have sent it to [place], please disreguard

## 2015-08-26 NOTE — Telephone Encounter (Signed)
Informed and patient stated that the pharmacy keeps sending her prescriptions to the wrong place. That they should have sent it to [facility].

## 2015-09-21 ENCOUNTER — Other Ambulatory Visit: Payer: Self-pay

## 2015-09-21 ENCOUNTER — Telehealth: Payer: Self-pay | Admitting: Family Medicine

## 2015-09-21 MED ORDER — GABAPENTIN (ONCE-DAILY) 300 MG PO TABS: 1.0000 | ORAL_TABLET | Freq: Three times a day (TID) | ORAL | Status: DC

## 2015-09-21 NOTE — Telephone Encounter (Signed)
Called Anderson Malta and informed her it is suppose to be one tablet 3 times daily.

## 2015-09-21 NOTE — Telephone Encounter (Signed)
Patient requesting refill. 

## 2015-09-21 NOTE — Telephone Encounter (Signed)
Anderson Malta from [facility] is needing clarification. Received prescription for gabapentin (generic) which says 3 per day but also received one for gralise (no generic) for once daily. Which one would you like to prescribe the patient. Please return call [phone]

## 2015-09-22 NOTE — Telephone Encounter (Signed)
Spoke with patient and she is no longer a patient here at [facility]. She is currently being seen at [facility].

## 2015-09-25 DIAGNOSIS — Z6841 Body Mass Index (BMI) 40.0 and over, adult: Secondary | ICD-10-CM | POA: Insufficient documentation

## 2015-11-23 ENCOUNTER — Other Ambulatory Visit: Payer: Self-pay

## 2015-11-23 MED ORDER — CETIRIZINE HCL 10 MG PO TABS: 10.0000 mg | ORAL_TABLET | Freq: Every day | ORAL | Status: AC

## 2015-11-23 NOTE — Telephone Encounter (Signed)
Got a fax from [facility] requesting a refill of this patient's Zyrtec 10mg .  Refill request was sent to Dr. Steele Sizer for approval and submission.

## 2016-05-09 DIAGNOSIS — R112 Nausea with vomiting, unspecified: Secondary | ICD-10-CM | POA: Insufficient documentation

## 2016-05-09 DIAGNOSIS — N179 Acute kidney failure, unspecified: Secondary | ICD-10-CM | POA: Insufficient documentation

## 2016-05-09 DIAGNOSIS — K5900 Constipation, unspecified: Secondary | ICD-10-CM | POA: Insufficient documentation

## 2016-05-18 DIAGNOSIS — E119 Type 2 diabetes mellitus without complications: Secondary | ICD-10-CM | POA: Insufficient documentation

## 2016-05-27 DIAGNOSIS — Z789 Other specified health status: Secondary | ICD-10-CM | POA: Insufficient documentation

## 2016-06-17 ENCOUNTER — Encounter: Payer: Self-pay | Admitting: Emergency Medicine

## 2016-06-17 ENCOUNTER — Emergency Department
Admission: EM | Admit: 2016-06-17 | Discharge: 2016-06-17 | Disposition: A | Discharge status: Home / Self Care | Payer: No Typology Code available for payment source | Attending: Student in an Organized Health Care Education/Training Program | Admitting: Student in an Organized Health Care Education/Training Program

## 2016-06-17 DIAGNOSIS — S161XXA Strain of muscle, fascia and tendon at neck level, initial encounter: Secondary | ICD-10-CM | POA: Diagnosis not present

## 2016-06-17 DIAGNOSIS — Z79899 Other long term (current) drug therapy: Secondary | ICD-10-CM | POA: Diagnosis not present

## 2016-06-17 DIAGNOSIS — Y92410 Unspecified street and highway as the place of occurrence of the external cause: Secondary | ICD-10-CM | POA: Diagnosis not present

## 2016-06-17 DIAGNOSIS — I110 Hypertensive heart disease with heart failure: Secondary | ICD-10-CM | POA: Diagnosis not present

## 2016-06-17 DIAGNOSIS — J45909 Unspecified asthma, uncomplicated: Secondary | ICD-10-CM | POA: Diagnosis not present

## 2016-06-17 DIAGNOSIS — Y9389 Activity, other specified: Secondary | ICD-10-CM | POA: Insufficient documentation

## 2016-06-17 DIAGNOSIS — Y999 Unspecified external cause status: Secondary | ICD-10-CM | POA: Insufficient documentation

## 2016-06-17 DIAGNOSIS — Z7982 Long term (current) use of aspirin: Secondary | ICD-10-CM | POA: Diagnosis not present

## 2016-06-17 DIAGNOSIS — M542 Cervicalgia: Secondary | ICD-10-CM | POA: Diagnosis present

## 2016-06-17 DIAGNOSIS — Z87891 Personal history of nicotine dependence: Secondary | ICD-10-CM | POA: Diagnosis not present

## 2016-06-17 DIAGNOSIS — I5032 Chronic diastolic (congestive) heart failure: Secondary | ICD-10-CM | POA: Insufficient documentation

## 2016-06-17 MED ORDER — CYCLOBENZAPRINE HCL 5 MG PO TABS: 5.0000 mg | ORAL_TABLET | Freq: Three times a day (TID) | ORAL | 0 refills | Status: AC | PRN

## 2016-06-17 NOTE — Discharge Instructions (Signed)
Please take Tylenol and Flexeril as needed for pain and muscle tightness. Follow-up with orthopedics on Tuesday if continued pain. Return to the ER for any worsening symptoms urgent changes in her health.
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
## 2016-07-11 ENCOUNTER — Other Ambulatory Visit: Payer: Self-pay

## 2019-06-12 ENCOUNTER — Other Ambulatory Visit: Payer: Self-pay

## 2019-06-12 DIAGNOSIS — Z20822 Contact with and (suspected) exposure to covid-19: Secondary | ICD-10-CM

## 2019-06-13 LAB — NOVEL CORONAVIRUS, NAA: SARS-CoV-2, NAA: NOT DETECTED

## 2019-06-13 LAB — SPECIMEN STATUS REPORT

## 2019-08-13 ENCOUNTER — Other Ambulatory Visit: Payer: Self-pay

## 2019-08-13 DIAGNOSIS — Z20822 Contact with and (suspected) exposure to covid-19: Secondary | ICD-10-CM

## 2019-08-14 LAB — NOVEL CORONAVIRUS, NAA: SARS-CoV-2, NAA: NOT DETECTED

## 2019-09-06 ENCOUNTER — Ambulatory Visit: Payer: Self-pay | Admitting: Podiatry

## 2019-09-10 ENCOUNTER — Encounter: Payer: Self-pay | Admitting: Podiatry

## 2019-09-10 ENCOUNTER — Other Ambulatory Visit: Payer: Self-pay

## 2019-09-10 ENCOUNTER — Ambulatory Visit (INDEPENDENT_AMBULATORY_CARE_PROVIDER_SITE_OTHER): Payer: Medicare Other | Admitting: Podiatry

## 2019-09-10 DIAGNOSIS — Z9989 Dependence on other enabling machines and devices: Secondary | ICD-10-CM | POA: Insufficient documentation

## 2019-09-10 DIAGNOSIS — M79675 Pain in left toe(s): Secondary | ICD-10-CM | POA: Diagnosis not present

## 2019-09-10 DIAGNOSIS — G4733 Obstructive sleep apnea (adult) (pediatric): Secondary | ICD-10-CM | POA: Insufficient documentation

## 2019-09-10 DIAGNOSIS — S90219A Contusion of unspecified great toe with damage to nail, initial encounter: Secondary | ICD-10-CM

## 2019-09-10 DIAGNOSIS — M79674 Pain in right toe(s): Secondary | ICD-10-CM

## 2019-09-10 DIAGNOSIS — H9190 Unspecified hearing loss, unspecified ear: Secondary | ICD-10-CM | POA: Insufficient documentation

## 2019-09-10 DIAGNOSIS — B351 Tinea unguium: Secondary | ICD-10-CM

## 2019-09-10 DIAGNOSIS — M109 Gout, unspecified: Secondary | ICD-10-CM | POA: Insufficient documentation

## 2019-09-10 DIAGNOSIS — F329 Major depressive disorder, single episode, unspecified: Secondary | ICD-10-CM | POA: Insufficient documentation

## 2019-09-10 DIAGNOSIS — R42 Dizziness and giddiness: Secondary | ICD-10-CM | POA: Insufficient documentation

## 2019-09-10 DIAGNOSIS — F32A Depression, unspecified: Secondary | ICD-10-CM | POA: Insufficient documentation

## 2019-09-13 ENCOUNTER — Encounter: Payer: Self-pay | Admitting: Podiatry

## 2019-09-13 NOTE — Progress Notes (Signed)
Subjective:  Patient ID: Joyce Fields, female    DOB: 1946/01/21,  MRN: [id number]  Chief Complaint  Patient presents with  . Nail Problem    nail trim     73 y.o. female presents with the above complaint.  Patient presents with painful thickened elongated toenails x10.  Patient states the left hallux is the worst out of all of them.  She would like to have it removed permanently as she cannot deal with the pain from the big toe with the thickened elongated mycotic toenail.  She states that she has been applying lotion to both of his skin as well as Vaseline to the toenail to help soften nail.  Patient states that none of this has really helped.  It has been causing her pain when she is ambulating.  She denies any other acute complaints.   Review of Systems: Negative except as noted in the HPI. Denies N/V/F/Ch.  Past Medical History:  Diagnosis Date  . Allergic rhinitis, cause unspecified   . Allergy   . Anemia   . Anemia, unspecified   . Apnea    CPAP  . Arthritis   . Asthma   . Carpal tunnel syndrome   . Dizziness and giddiness   . Dysmetabolic syndrome X   . GERD (gastroesophageal reflux disease)   . Gout   . Headache(784.0)   . Heart murmur   . Hemorrhoid 2012  . Hyperlipidemia   . Hypertension   . Ingrowing nail   . Migraine   . Morbid obesity ([place])   . Obstructive sleep apnea (adult) (pediatric)   . Osteoarthrosis, unspecified whether generalized or localized, unspecified site   . Other abnormal blood chemistry   . Other abnormal glucose   . Other seborrheic keratosis   . Pain in joint, site unspecified   . Sarcoidosis   . Unspecified hearing loss   . Unspecified hypertrophic and atrophic condition of skin   . Unspecified tinnitus   . Unspecified urinary incontinence   . Vertigo   . Vertigo     Current Outpatient Medications:  .  diazepam (VALIUM) 2 MG tablet, TAKE ONE TABLET EVERY SIX (6) HOURS AS NEEDED FOR ANXIETY, Disp: , Rfl:  .  DULoxetine  (CYMBALTA) 20 MG capsule, Take 20 mg by mouth., Disp: , Rfl:  .  polyethylene glycol powder (GLYCOLAX/MIRALAX) 17 GM/SCOOP powder, Take by mouth., Disp: , Rfl:  .  sodium chloride (OCEAN) 0.65 % nasal spray, 1 spray by Each Nare route daily., Disp: , Rfl:  .  acetaminophen (TYLENOL) 325 MG tablet, Take 650 mg by mouth as needed., Disp: , Rfl:  .  albuterol (PROVENTIL HFA;VENTOLIN HFA) 108 (90 BASE) MCG/ACT inhaler, Inhale into the lungs every 6 (six) hours as needed for wheezing or shortness of breath., Disp: , Rfl:  .  allopurinol (ZYLOPRIM) 100 MG tablet, Take 100 mg by mouth daily., Disp: , Rfl:  .  ALPRAZolam (XANAX) 0.5 MG tablet, Take 0.5 mg by mouth at bedtime as needed for anxiety., Disp: , Rfl:  .  amLODipine-olmesartan (AZOR) 5-20 MG per tablet, Take 1 tablet by mouth daily., Disp: , Rfl:  .  aspirin 81 MG tablet, Take 81 mg by mouth daily., Disp: , Rfl:  .  calcitRIOL (ROCALTROL) 0.25 MCG capsule, , Disp: , Rfl:  .  cetirizine (ZYRTEC) 10 MG tablet, Take 1 tablet (10 mg total) by mouth daily., Disp: 30 tablet, Rfl: 5 .  clonazePAM (KLONOPIN) 0.5 MG tablet, Take 0.5 mg by  mouth daily as needed., Disp: , Rfl:  .  cyclobenzaprine (FLEXERIL) 5 MG tablet, Take 1-2 tablets (5-10 mg total) by mouth 3 (three) times daily as needed for muscle spasms., Disp: 20 tablet, Rfl: 0 .  desonide (DESOWEN) 0.05 % lotion, Apply topically 2 (two) times daily., Disp: , Rfl:  .  Ergocalciferol (VITAMIN D2) 2000 UNITS TABS, Take by mouth daily., Disp: , Rfl:  .  FLUoxetine (PROZAC) 40 MG capsule, Take 40 mg by mouth daily., Disp: , Rfl:  .  fluticasone (FLONASE) 50 MCG/ACT nasal spray, Place 2 sprays into both nostrils as needed. , Disp: , Rfl:  .  furosemide (LASIX) 40 MG tablet, Take 1 tablet (40 mg total) by mouth 2 (two) times daily., Disp: 180 tablet, Rfl: 3 .  hydrOXYzine (ATARAX/VISTARIL) 10 MG tablet, Take 10 mg by mouth as needed., Disp: , Rfl:  .  omeprazole (PRILOSEC) 40 MG capsule, Take 40 mg by  mouth daily., Disp: , Rfl:  .  ondansetron (ZOFRAN) 8 MG tablet, Take 8 mg by mouth., Disp: , Rfl:  .  potassium chloride SA (K-DUR,KLOR-CON) 20 MEQ tablet, Take 1 tablet (20 mEq total) by mouth daily., Disp: 90 tablet, Rfl: 3 .  prazosin (MINIPRESS) 1 MG capsule, Take 1 mg by mouth at bedtime., Disp: , Rfl:  .  rosuvastatin (CRESTOR) 20 MG tablet, Take 20 mg by mouth daily., Disp: , Rfl:  .  solifenacin (VESICARE) 10 MG tablet, Take 10 mg by mouth daily., Disp: , Rfl:  .  spironolactone (ALDACTONE) 25 MG tablet, Take 25 mg by mouth daily., Disp: , Rfl:  .  topiramate (TOPAMAX) 25 MG tablet, , Disp: , Rfl:  .  venlafaxine XR (EFFEXOR-XR) 150 MG 24 hr capsule, Take 150 mg by mouth daily., Disp: , Rfl:  .  verapamil (CALAN-SR) 120 MG CR tablet, Take 120 mg by mouth daily., Disp: , Rfl:  .  WIXELA INHUB 250-50 MCG/DOSE AEPB, , Disp: , Rfl:   Social History   Tobacco Use  Smoking Status Former Smoker  . Packs/day: 1.00  . Years: 25.00  . Pack years: 25.00  . Types: Cigarettes  Smokeless Tobacco Never Used    Allergies  Allergen Reactions  . Bee Venom Shortness Of Breath    swelling  . Metformin Other (See Comments)    Kidney issues  . Gabapentin Other (See Comments)    Causes constipation  . Ibuprofen     Kidney injury  . Niaspan [Niacin Er] Other (See Comments)    Hot flashes  . Welchol [Colesevelam Hcl] Nausea And Vomiting    indigestion  . Colesevelam Nausea And Vomiting    Indigestion, burning all over   Objective:  There were no vitals filed for this visit. There is no height or weight on file to calculate BMI. Constitutional Well developed. Well nourished.  Vascular Dorsalis pedis pulses palpable bilaterally. Posterior tibial pulses palpable bilaterally. Capillary refill normal to all digits.  No cyanosis or clubbing noted. Pedal hair growth normal.  Neurologic Normal speech. Oriented to person, place, and time. Epicritic sensation to light touch grossly present  bilaterally.  Dermatologic Pain on palpation of the entire/total nail on 1st digit of the left No other open wounds. No skin lesions. Bilateral thickened elongated mycotic dark discolored toenails x10.  They are painful on touch.  Orthopedic: Normal joint ROM without pain or crepitus bilaterally. No visible deformities. No bony tenderness.   Radiographs: None Assessment:  No diagnosis found. Plan:  Patient was evaluated  and treated and all questions answered.  Nail contusion/dystrophy left hallux, left -Patient elects to proceed with minor surgery to remove entire toenail today. Consent reviewed and signed by patient. -Entire/total nail excised. See procedure note. -Educated on post-procedure care including soaking. Written instructions provided and reviewed. -Patient to follow up in 2 weeks for nail check.  Procedure: Excision of entire/total nail with matrixectomy Location: Left 1st toe digit Anesthesia: Lidocaine 1% plain; 1.5 mL and Marcaine 0.5% plain; 1.5 mL, digital block. Skin Prep: Betadine. Dressing: Silvadene; telfa; dry, sterile, compression dressing. Technique: Following skin prep, the toe was exsanguinated and a tourniquet was secured at the base of the toe. The affected nail border was freed and excised.  Phenol was applied to destroy the matrix of the nail to prevent it from regrowing.  The tourniquet was then removed and sterile dressing applied. Disposition: Patient tolerated procedure well. Patient to return in 2 weeks for follow-up.   Onychomycosis with pain  -Nails palliatively debrided as below. -Educated on self-care  Procedure: Nail Debridement Rationale: pain  Type of Debridement: manual, sharp debridement. Instrumentation: Nail nipper, rotary burr. Number of Nails: 9  Procedures and Treatment: Consent by patient was obtained for treatment procedures. The patient understood the discussion of treatment and procedures well. All questions were answered  thoroughly reviewed. Debridement of mycotic and hypertrophic toenails, 1 through 5 bilateral and clearing of subungual debris. No ulceration, no infection noted.  Return Visit-Office Procedure: Patient instructed to return to the office for a follow up visit 3 months for continued evaluation and treatment.   Boneta Lucks, DPM    No follow-ups on file.

## 2019-12-10 ENCOUNTER — Other Ambulatory Visit: Payer: Self-pay

## 2019-12-10 ENCOUNTER — Ambulatory Visit (INDEPENDENT_AMBULATORY_CARE_PROVIDER_SITE_OTHER): Payer: Medicare Other | Admitting: Podiatry

## 2019-12-10 DIAGNOSIS — M7752 Other enthesopathy of left foot: Secondary | ICD-10-CM

## 2019-12-10 DIAGNOSIS — M79674 Pain in right toe(s): Secondary | ICD-10-CM

## 2019-12-10 DIAGNOSIS — B351 Tinea unguium: Secondary | ICD-10-CM | POA: Diagnosis not present

## 2019-12-10 DIAGNOSIS — M79675 Pain in left toe(s): Secondary | ICD-10-CM | POA: Diagnosis not present

## 2019-12-10 DIAGNOSIS — R200 Anesthesia of skin: Secondary | ICD-10-CM | POA: Diagnosis not present

## 2019-12-11 ENCOUNTER — Encounter: Payer: Self-pay | Admitting: Podiatry

## 2019-12-11 NOTE — Progress Notes (Signed)
Subjective:  Patient ID: Joyce Fields, female    DOB: 1946-03-23,  MRN: [id number]  Chief Complaint  Patient presents with  . Nail Problem    Joyce Fields is here for routine foot care, as well as a nail check of the left big toenail, Joyce Fields is concerned that the left big toenail is still sticking up, but states that he has no other comments or concerns    74 y.o. female presents with the above complaint.  Patient presents with painful thickened elongated toenails x9.  Patient states the left hallux is the worst out of all of them.  She would like to have it removed permanently as she cannot deal with the pain from the big toe with the thickened elongated mycotic toenail.  She states that she has been applying lotion to both of his skin as well as Vaseline to the toenail to help soften nail.  Patient states that none of this has really helped.  It has been causing her pain when she is ambulating.  Patient has a secondary complaint of left fourth digit dorsal pain.  Patient states that it is very mild in nature.  She just wanted to get evaluated make sure that there is nothing going on.  She states her pain is like 1 out of 10.   Review of Systems: Negative except as noted in the HPI. Denies N/V/F/Ch.  Past Medical History:  Diagnosis Date  . Allergic rhinitis, cause unspecified   . Allergy   . Anemia   . Anemia, unspecified   . Apnea    CPAP  . Arthritis   . Asthma   . Carpal tunnel syndrome   . Dizziness and giddiness   . Dysmetabolic syndrome X   . GERD (gastroesophageal reflux disease)   . Gout   . Headache(784.0)   . Heart murmur   . Hemorrhoid 2012  . Hyperlipidemia   . Hypertension   . Ingrowing nail   . Migraine   . Morbid obesity ([place])   . Obstructive sleep apnea (adult) (pediatric)   . Osteoarthrosis, unspecified whether generalized or localized, unspecified site   . Other abnormal blood chemistry   . Other abnormal glucose   . Other seborrheic keratosis   . Pain in joint,  site unspecified   . Sarcoidosis   . Unspecified hearing loss   . Unspecified hypertrophic and atrophic condition of skin   . Unspecified tinnitus   . Unspecified urinary incontinence   . Vertigo   . Vertigo     Current Outpatient Medications:  .  acetaminophen (TYLENOL) 325 MG tablet, Take 650 mg by mouth as needed., Disp: , Rfl:  .  albuterol (PROVENTIL HFA;VENTOLIN HFA) 108 (90 BASE) MCG/ACT inhaler, Inhale into the lungs every 6 (six) hours as needed for wheezing or shortness of breath., Disp: , Rfl:  .  allopurinol (ZYLOPRIM) 100 MG tablet, Take 100 mg by mouth daily., Disp: , Rfl:  .  ALPRAZolam (XANAX) 0.5 MG tablet, Take 0.5 mg by mouth at bedtime as needed for anxiety., Disp: , Rfl:  .  amLODipine-olmesartan (AZOR) 5-20 MG per tablet, Take 1 tablet by mouth daily., Disp: , Rfl:  .  aspirin 81 MG tablet, Take 81 mg by mouth daily., Disp: , Rfl:  .  calcitRIOL (ROCALTROL) 0.25 MCG capsule, , Disp: , Rfl:  .  cetirizine (ZYRTEC) 10 MG tablet, Take 1 tablet (10 mg total) by mouth daily., Disp: 30 tablet, Rfl: 5 .  clonazePAM (KLONOPIN) 0.5 MG tablet,  Take 0.5 mg by mouth daily as needed., Disp: , Rfl:  .  cyclobenzaprine (FLEXERIL) 5 MG tablet, Take 1-2 tablets (5-10 mg total) by mouth 3 (three) times daily as needed for muscle spasms., Disp: 20 tablet, Rfl: 0 .  desonide (DESOWEN) 0.05 % lotion, Apply topically 2 (two) times daily., Disp: , Rfl:  .  diazepam (VALIUM) 2 MG tablet, TAKE ONE TABLET EVERY SIX (6) HOURS AS NEEDED FOR ANXIETY, Disp: , Rfl:  .  DULoxetine (CYMBALTA) 20 MG capsule, Take 20 mg by mouth., Disp: , Rfl:  .  Ergocalciferol (VITAMIN D2) 2000 UNITS TABS, Take by mouth daily., Disp: , Rfl:  .  FLUoxetine (PROZAC) 40 MG capsule, Take 40 mg by mouth daily., Disp: , Rfl:  .  fluticasone (FLONASE) 50 MCG/ACT nasal spray, Place 2 sprays into both nostrils as needed. , Disp: , Rfl:  .  furosemide (LASIX) 40 MG tablet, Take 1 tablet (40 mg total) by mouth 2 (two) times  daily., Disp: 180 tablet, Rfl: 3 .  hydrOXYzine (ATARAX/VISTARIL) 10 MG tablet, Take 10 mg by mouth as needed., Disp: , Rfl:  .  omeprazole (PRILOSEC) 40 MG capsule, Take 40 mg by mouth daily., Disp: , Rfl:  .  ondansetron (ZOFRAN) 8 MG tablet, Take 8 mg by mouth., Disp: , Rfl:  .  polyethylene glycol powder (GLYCOLAX/MIRALAX) 17 GM/SCOOP powder, Take by mouth., Disp: , Rfl:  .  potassium chloride SA (K-DUR,KLOR-CON) 20 MEQ tablet, Take 1 tablet (20 mEq total) by mouth daily., Disp: 90 tablet, Rfl: 3 .  prazosin (MINIPRESS) 1 MG capsule, Take 1 mg by mouth at bedtime., Disp: , Rfl:  .  rosuvastatin (CRESTOR) 20 MG tablet, Take 20 mg by mouth daily., Disp: , Rfl:  .  sodium chloride (OCEAN) 0.65 % nasal spray, 1 spray by Each Nare route daily., Disp: , Rfl:  .  solifenacin (VESICARE) 10 MG tablet, Take 10 mg by mouth daily., Disp: , Rfl:  .  spironolactone (ALDACTONE) 25 MG tablet, Take 25 mg by mouth daily., Disp: , Rfl:  .  topiramate (TOPAMAX) 25 MG tablet, , Disp: , Rfl:  .  venlafaxine XR (EFFEXOR-XR) 150 MG 24 hr capsule, Take 150 mg by mouth daily., Disp: , Rfl:  .  verapamil (CALAN-SR) 120 MG CR tablet, Take 120 mg by mouth daily., Disp: , Rfl:  .  WIXELA INHUB 250-50 MCG/DOSE AEPB, , Disp: , Rfl:   Social History   Tobacco Use  Smoking Status Former Smoker  . Packs/day: 1.00  . Years: 25.00  . Pack years: 25.00  . Types: Cigarettes  Smokeless Tobacco Never Used    Allergies  Allergen Reactions  . Bee Venom Shortness Of Breath    swelling  . Metformin Other (See Comments)    Kidney issues  . Gabapentin Other (See Comments)    Causes constipation  . Ibuprofen     Kidney injury  . Niaspan [Niacin Er] Other (See Comments)    Hot flashes  . Welchol [Colesevelam Hcl] Nausea And Vomiting    indigestion  . Colesevelam Nausea And Vomiting    Indigestion, burning all over   Objective:  There were no vitals filed for this visit. There is no height or weight on file to  calculate BMI. Constitutional Well developed. Well nourished.  Vascular Dorsalis pedis pulses palpable bilaterally. Posterior tibial pulses palpable bilaterally. Capillary refill normal to all digits.  No cyanosis or clubbing noted. Pedal hair growth normal.  Neurologic Normal speech. Oriented to  person, place, and time. Epicritic sensation to light touch grossly present bilaterally.  Dermatologic  no pain on palpation to the left no hallux nail bed. No other open wounds. No skin lesions. Bilateral thickened elongated mycotic dark discolored toenails x 9.  They are painful on touch.  Orthopedic: Normal joint ROM without pain or crepitus bilaterally. No visible deformities. No bony tenderness.   Radiographs: None Assessment:   1. Pain due to onychomycosis of toenails of both feet   2. Capsulitis of toe, left   3. Bilateral leg numbness    Plan:  Patient was evaluated and treated and all questions answered.  Nail contusion/dystrophy left hallux, left -Healed well without any acute complication.  Left fourth digit capsulitis -I explained to the patient the etiology of capsulitis and various treatment options associated with it.  I believe patient will benefit from a steroid injection however patient would like to hold off as her pain is very mild in nature and she is afraid of needles.  I explained to the patient in the future if the pain gets progressively worse we will consider steroid injection at that time.  She states understanding  Onychomycosis with pain  -Nails palliatively debrided as below. -Educated on self-care  Procedure: Nail Debridement Rationale: pain  Type of Debridement: manual, sharp debridement. Instrumentation: Nail nipper, rotary burr. Number of Nails: 9  Procedures and Treatment: Consent by patient was obtained for treatment procedures. The patient understood the discussion of treatment and procedures well. All questions were answered thoroughly  reviewed. Debridement of mycotic and hypertrophic toenails, 1 through 5 bilateral and clearing of subungual debris. No ulceration, no infection noted.  Return Visit-Office Procedure: Patient instructed to return to the office for a follow up visit 3 months for continued evaluation and treatment.   Boneta Lucks, DPM    No follow-ups on file.

## 2020-03-10 ENCOUNTER — Ambulatory Visit: Payer: Medicare Other | Admitting: Podiatry

## 2020-04-30 ENCOUNTER — Other Ambulatory Visit: Payer: Self-pay

## 2020-04-30 ENCOUNTER — Ambulatory Visit (INDEPENDENT_AMBULATORY_CARE_PROVIDER_SITE_OTHER): Payer: Medicare Other

## 2020-04-30 ENCOUNTER — Ambulatory Visit (INDEPENDENT_AMBULATORY_CARE_PROVIDER_SITE_OTHER): Payer: Medicare Other | Admitting: Podiatry

## 2020-04-30 ENCOUNTER — Other Ambulatory Visit: Payer: Self-pay | Admitting: Podiatry

## 2020-04-30 ENCOUNTER — Encounter: Payer: Self-pay | Admitting: Podiatry

## 2020-04-30 DIAGNOSIS — M76822 Posterior tibial tendinitis, left leg: Secondary | ICD-10-CM

## 2020-04-30 DIAGNOSIS — M19072 Primary osteoarthritis, left ankle and foot: Secondary | ICD-10-CM

## 2020-04-30 DIAGNOSIS — M779 Enthesopathy, unspecified: Secondary | ICD-10-CM | POA: Diagnosis not present

## 2020-04-30 DIAGNOSIS — M19079 Primary osteoarthritis, unspecified ankle and foot: Secondary | ICD-10-CM

## 2020-05-01 ENCOUNTER — Encounter: Payer: Self-pay | Admitting: Podiatry

## 2020-05-01 NOTE — Progress Notes (Signed)
Subjective:  Patient ID: Joyce Fields, female    DOB: 1945/11/06,  MRN: [id number]  Chief Complaint  Patient presents with  . Foot Pain    Patient presents today for left foot pain, top of foot to arch and heel x 1 week    74 y.o. female presents with the above complaint.  Patient presents with a complaint of left medial foot pain that has been for quite some time.  Patient arch as well as the heel.  Patient states for a week.  She has sharp stabbing stinging pain.  She is primary care physician early this week we will give her prednisone which helped a little bit.  She has not seen by me for a long period of time.  She states overall she is doing well from all the all the previous diagnosis.  She denies any other acute complaints.  She is ambulating with antalgic gait.   Review of Systems: Negative except as noted in the HPI. Denies N/V/F/Ch.  Past Medical History:  Diagnosis Date  . Allergic rhinitis, cause unspecified   . Allergy   . Anemia   . Anemia, unspecified   . Apnea    CPAP  . Arthritis   . Asthma   . Carpal tunnel syndrome   . Dizziness and giddiness   . Dysmetabolic syndrome X   . GERD (gastroesophageal reflux disease)   . Gout   . Headache(784.0)   . Heart murmur   . Hemorrhoid 2012  . Hyperlipidemia   . Hypertension   . Ingrowing nail   . Migraine   . Morbid obesity ([place])   . Obstructive sleep apnea (adult) (pediatric)   . Osteoarthrosis, unspecified whether generalized or localized, unspecified site   . Other abnormal blood chemistry   . Other abnormal glucose   . Other seborrheic keratosis   . Pain in joint, site unspecified   . Sarcoidosis   . Unspecified hearing loss   . Unspecified hypertrophic and atrophic condition of skin   . Unspecified tinnitus   . Unspecified urinary incontinence   . Vertigo   . Vertigo     Current Outpatient Medications:  .  acetaminophen (TYLENOL) 325 MG tablet, Take 650 mg by mouth as needed., Disp: , Rfl:  .   albuterol (PROVENTIL HFA;VENTOLIN HFA) 108 (90 BASE) MCG/ACT inhaler, Inhale into the lungs every 6 (six) hours as needed for wheezing or shortness of breath., Disp: , Rfl:  .  allopurinol (ZYLOPRIM) 100 MG tablet, Take 100 mg by mouth daily., Disp: , Rfl:  .  ALPRAZolam (XANAX) 0.5 MG tablet, Take 0.5 mg by mouth at bedtime as needed for anxiety., Disp: , Rfl:  .  amLODipine-olmesartan (AZOR) 5-20 MG per tablet, Take 1 tablet by mouth daily., Disp: , Rfl:  .  aspirin 81 MG tablet, Take 81 mg by mouth daily., Disp: , Rfl:  .  calcitRIOL (ROCALTROL) 0.25 MCG capsule, , Disp: , Rfl:  .  cetirizine (ZYRTEC) 10 MG tablet, Take 1 tablet (10 mg total) by mouth daily., Disp: 30 tablet, Rfl: 5 .  clonazePAM (KLONOPIN) 0.5 MG tablet, Take 0.5 mg by mouth daily as needed., Disp: , Rfl:  .  cyclobenzaprine (FLEXERIL) 5 MG tablet, Take 1-2 tablets (5-10 mg total) by mouth 3 (three) times daily as needed for muscle spasms., Disp: 20 tablet, Rfl: 0 .  desonide (DESOWEN) 0.05 % lotion, Apply topically 2 (two) times daily., Disp: , Rfl:  .  diazepam (VALIUM) 2 MG tablet, TAKE  ONE TABLET EVERY SIX (6) HOURS AS NEEDED FOR ANXIETY, Disp: , Rfl:  .  DULoxetine (CYMBALTA) 20 MG capsule, Take 20 mg by mouth., Disp: , Rfl:  .  Ergocalciferol (VITAMIN D2) 2000 UNITS TABS, Take by mouth daily., Disp: , Rfl:  .  FLUoxetine (PROZAC) 40 MG capsule, Take 40 mg by mouth daily., Disp: , Rfl:  .  fluticasone (FLONASE) 50 MCG/ACT nasal spray, Place 2 sprays into both nostrils as needed. , Disp: , Rfl:  .  furosemide (LASIX) 40 MG tablet, Take 1 tablet (40 mg total) by mouth 2 (two) times daily., Disp: 180 tablet, Rfl: 3 .  hydrOXYzine (ATARAX/VISTARIL) 10 MG tablet, Take 10 mg by mouth as needed., Disp: , Rfl:  .  omeprazole (PRILOSEC) 40 MG capsule, Take 40 mg by mouth daily., Disp: , Rfl:  .  ondansetron (ZOFRAN) 8 MG tablet, Take 8 mg by mouth., Disp: , Rfl:  .  polyethylene glycol powder (GLYCOLAX/MIRALAX) 17 GM/SCOOP powder,  Take by mouth., Disp: , Rfl:  .  potassium chloride SA (K-DUR,KLOR-CON) 20 MEQ tablet, Take 1 tablet (20 mEq total) by mouth daily., Disp: 90 tablet, Rfl: 3 .  prazosin (MINIPRESS) 1 MG capsule, Take 1 mg by mouth at bedtime., Disp: , Rfl:  .  rosuvastatin (CRESTOR) 20 MG tablet, Take 20 mg by mouth daily., Disp: , Rfl:  .  sodium chloride (OCEAN) 0.65 % nasal spray, 1 spray by Each Nare route daily., Disp: , Rfl:  .  solifenacin (VESICARE) 10 MG tablet, Take 10 mg by mouth daily., Disp: , Rfl:  .  spironolactone (ALDACTONE) 25 MG tablet, Take 25 mg by mouth daily., Disp: , Rfl:  .  topiramate (TOPAMAX) 25 MG tablet, , Disp: , Rfl:  .  venlafaxine XR (EFFEXOR-XR) 150 MG 24 hr capsule, Take 150 mg by mouth daily., Disp: , Rfl:  .  verapamil (CALAN-SR) 120 MG CR tablet, Take 120 mg by mouth daily., Disp: , Rfl:  .  WIXELA INHUB 250-50 MCG/DOSE AEPB, , Disp: , Rfl:   Social History   Tobacco Use  Smoking Status Former Smoker  . Packs/day: 1.00  . Years: 25.00  . Pack years: 25.00  . Types: Cigarettes  Smokeless Tobacco Never Used    Allergies  Allergen Reactions  . Bee Venom Shortness Of Breath    swelling  . Metformin Other (See Comments)    Kidney issues  . Gabapentin Other (See Comments)    Causes constipation  . Ibuprofen     Kidney injury  . Niaspan [Niacin Er] Other (See Comments)    Hot flashes  . Welchol [Colesevelam Hcl] Nausea And Vomiting    indigestion  . Colesevelam Nausea And Vomiting    Indigestion, burning all over   Objective:  There were no vitals filed for this visit. There is no height or weight on file to calculate BMI. Constitutional Well developed. Well nourished.  Vascular Dorsalis pedis pulses palpable bilaterally. Posterior tibial pulses palpable bilaterally. Capillary refill normal to all digits.  No cyanosis or clubbing noted. Pedal hair growth normal.  Neurologic Normal speech. Oriented to person, place, and time. Epicritic sensation to  light touch grossly present bilaterally.  Dermatologic Nails well groomed and normal in appearance. No open wounds. No skin lesions.  Orthopedic:  Pain on palpation along the course of the posterior tibial tendon and as well as his insertion.  Pain with dorsiflexion and eversion of the foot.  Pain with inversion plantarflexion of the foot active and  passive.  No pain at the peroneal tendon, ATFL ligament, Achilles tendon.   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 Plantar posterior heel spurring noted.  History of previous fifth metatarsal base fracture noted.  No other osseous abnormalities noted. Assessment:   1. Tendinitis   2. Posterior tibial tendinitis of left leg    Plan:  Patient was evaluated and treated and all questions answered.  Left posterior tibial tendinitis -I explained to the patient the etiology of posterior tibial tendinitis and various treatment options were extensively discussed.  Given the amount of pain that she is having which is moderate to severe I believe patient will benefit from complete immobilization of the foot with a cam boot. -Cam boot was dispensed -If there is no improvement will consider doing a steroid injection with a possible MRI evaluation.  No follow-ups on file.

## 2020-05-28 ENCOUNTER — Encounter: Payer: Self-pay | Admitting: Podiatry

## 2020-05-28 ENCOUNTER — Ambulatory Visit (INDEPENDENT_AMBULATORY_CARE_PROVIDER_SITE_OTHER): Payer: Medicare Other | Admitting: Podiatry

## 2020-05-28 ENCOUNTER — Other Ambulatory Visit: Payer: Self-pay

## 2020-05-28 DIAGNOSIS — Q666 Other congenital valgus deformities of feet: Secondary | ICD-10-CM

## 2020-05-28 DIAGNOSIS — M76822 Posterior tibial tendinitis, left leg: Secondary | ICD-10-CM

## 2020-05-29 ENCOUNTER — Encounter: Payer: Self-pay | Admitting: Podiatry

## 2020-05-29 NOTE — Progress Notes (Signed)
Subjective:  Patient ID: Joyce Fields, female    DOB: 1946/08/16,  MRN: [id number]  Chief Complaint  Patient presents with  . Foot Pain    "its some better, but still hurts at times.  The boot really helped"    74 y.o. female presents with the above complaint.  Patient presents with a follow-up of left posterior tibial tendinitis.  Patient states that the pain has gotten better since walking with the boot on.  She states she still has some pain though.  She denies any other acute complaints.  She would like to discuss further treatment options as well as bracing.   Review of Systems: Negative except as noted in the HPI. Denies N/V/F/Ch.  Past Medical History:  Diagnosis Date  . Allergic rhinitis, cause unspecified   . Allergy   . Anemia   . Anemia, unspecified   . Apnea    CPAP  . Arthritis   . Asthma   . Carpal tunnel syndrome   . Dizziness and giddiness   . Dysmetabolic syndrome X   . GERD (gastroesophageal reflux disease)   . Gout   . Headache(784.0)   . Heart murmur   . Hemorrhoid 2012  . Hyperlipidemia   . Hypertension   . Ingrowing nail   . Migraine   . Morbid obesity ([place])   . Obstructive sleep apnea (adult) (pediatric)   . Osteoarthrosis, unspecified whether generalized or localized, unspecified site   . Other abnormal blood chemistry   . Other abnormal glucose   . Other seborrheic keratosis   . Pain in joint, site unspecified   . Sarcoidosis   . Unspecified hearing loss   . Unspecified hypertrophic and atrophic condition of skin   . Unspecified tinnitus   . Unspecified urinary incontinence   . Vertigo   . Vertigo     Current Outpatient Medications:  .  acetaminophen (TYLENOL) 325 MG tablet, Take 650 mg by mouth as needed., Disp: , Rfl:  .  albuterol (PROVENTIL HFA;VENTOLIN HFA) 108 (90 BASE) MCG/ACT inhaler, Inhale into the lungs every 6 (six) hours as needed for wheezing or shortness of breath., Disp: , Rfl:  .  allopurinol (ZYLOPRIM) 100 MG  tablet, Take 100 mg by mouth daily., Disp: , Rfl:  .  ALPRAZolam (XANAX) 0.5 MG tablet, Take 0.5 mg by mouth at bedtime as needed for anxiety., Disp: , Rfl:  .  amLODipine-olmesartan (AZOR) 5-20 MG per tablet, Take 1 tablet by mouth daily., Disp: , Rfl:  .  aspirin 81 MG tablet, Take 81 mg by mouth daily., Disp: , Rfl:  .  calcitRIOL (ROCALTROL) 0.25 MCG capsule, , Disp: , Rfl:  .  cetirizine (ZYRTEC) 10 MG tablet, Take 1 tablet (10 mg total) by mouth daily., Disp: 30 tablet, Rfl: 5 .  clonazePAM (KLONOPIN) 0.5 MG tablet, Take 0.5 mg by mouth daily as needed., Disp: , Rfl:  .  cyclobenzaprine (FLEXERIL) 5 MG tablet, Take 1-2 tablets (5-10 mg total) by mouth 3 (three) times daily as needed for muscle spasms., Disp: 20 tablet, Rfl: 0 .  desonide (DESOWEN) 0.05 % lotion, Apply topically 2 (two) times daily., Disp: , Rfl:  .  diazepam (VALIUM) 2 MG tablet, TAKE ONE TABLET EVERY SIX (6) HOURS AS NEEDED FOR ANXIETY, Disp: , Rfl:  .  DULoxetine (CYMBALTA) 20 MG capsule, Take 20 mg by mouth., Disp: , Rfl:  .  Ergocalciferol (VITAMIN D2) 2000 UNITS TABS, Take by mouth daily., Disp: , Rfl:  .  FLUoxetine (  PROZAC) 40 MG capsule, Take 40 mg by mouth daily., Disp: , Rfl:  .  fluticasone (FLONASE) 50 MCG/ACT nasal spray, Place 2 sprays into both nostrils as needed. , Disp: , Rfl:  .  furosemide (LASIX) 40 MG tablet, Take 1 tablet (40 mg total) by mouth 2 (two) times daily., Disp: 180 tablet, Rfl: 3 .  hydrOXYzine (ATARAX/VISTARIL) 10 MG tablet, Take 10 mg by mouth as needed., Disp: , Rfl:  .  omeprazole (PRILOSEC) 40 MG capsule, Take 40 mg by mouth daily., Disp: , Rfl:  .  ondansetron (ZOFRAN) 8 MG tablet, Take 8 mg by mouth., Disp: , Rfl:  .  polyethylene glycol powder (GLYCOLAX/MIRALAX) 17 GM/SCOOP powder, Take by mouth., Disp: , Rfl:  .  potassium chloride SA (K-DUR,KLOR-CON) 20 MEQ tablet, Take 1 tablet (20 mEq total) by mouth daily., Disp: 90 tablet, Rfl: 3 .  prazosin (MINIPRESS) 1 MG capsule, Take 1 mg  by mouth at bedtime., Disp: , Rfl:  .  rosuvastatin (CRESTOR) 20 MG tablet, Take 20 mg by mouth daily., Disp: , Rfl:  .  sodium chloride (OCEAN) 0.65 % nasal spray, 1 spray by Each Nare route daily., Disp: , Rfl:  .  solifenacin (VESICARE) 10 MG tablet, Take 10 mg by mouth daily., Disp: , Rfl:  .  spironolactone (ALDACTONE) 25 MG tablet, Take 25 mg by mouth daily., Disp: , Rfl:  .  topiramate (TOPAMAX) 25 MG tablet, , Disp: , Rfl:  .  venlafaxine XR (EFFEXOR-XR) 150 MG 24 hr capsule, Take 150 mg by mouth daily., Disp: , Rfl:  .  verapamil (CALAN-SR) 120 MG CR tablet, Take 120 mg by mouth daily., Disp: , Rfl:  .  WIXELA INHUB 250-50 MCG/DOSE AEPB, , Disp: , Rfl:   Social History   Tobacco Use  Smoking Status Former Smoker  . Packs/day: 1.00  . Years: 25.00  . Pack years: 25.00  . Types: Cigarettes  Smokeless Tobacco Never Used    Allergies  Allergen Reactions  . Bee Venom Shortness Of Breath    swelling  . Metformin Other (See Comments)    Kidney issues  . Gabapentin Other (See Comments)    Causes constipation  . Ibuprofen     Kidney injury  . Niaspan [Niacin Er] Other (See Comments)    Hot flashes  . Welchol [Colesevelam Hcl] Nausea And Vomiting    indigestion  . Colesevelam Nausea And Vomiting    Indigestion, burning all over   Objective:  There were no vitals filed for this visit. There is no height or weight on file to calculate BMI. Constitutional Well developed. Well nourished.  Vascular Dorsalis pedis pulses palpable bilaterally. Posterior tibial pulses palpable bilaterally. Capillary refill normal to all digits.  No cyanosis or clubbing noted. Pedal hair growth normal.  Neurologic Normal speech. Oriented to person, place, and time. Epicritic sensation to light touch grossly present bilaterally.  Dermatologic Nails well groomed and normal in appearance. No open wounds. No skin lesions.  Orthopedic:  Pain on palpation along the course of the posterior tibial  tendon and as well as his insertion.  Pain with dorsiflexion and eversion of the foot.  Pain with inversion plantarflexion of the foot active and passive.  No pain at the peroneal tendon, ATFL ligament, Achilles tendon.   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 Plantar posterior heel spurring noted.  History of previous fifth metatarsal base fracture noted.  No other osseous abnormalities  noted. Assessment:   1. Posterior tibial tendinitis of left leg   2. Pes planovalgus    Plan:  Patient was evaluated and treated and all questions answered.  Left posterior tibial tendinitis -I explained to the patient the etiology of posterior tibial tendinitis and various treatment options were extensively discussed.  Patient states that she had some improvement but not completely resolved.  I have asked her to continue using the cam boot.  Given the amount of pain in the residual pain that she is having I believe she will benefit from a steroid injection help decrease her daily component of inflammation associated with the tendon.  I discussed with her the risk of steroids and tendon rupture.  Patient would like to proceed with the injection despite the risks. -A steroid injection was performed at left point of maximal tenderness medial foot using 1% plain Lidocaine and 10 mg of Kenalog. This was well tolerated. -If there is no improvement will consider doing an MRI evaluation.  Pes planovalgus with underlying posterior tibial tendinitis -I explained patient the etiology of pes planovalgus and various treatment options were extensively discussed.  Given that patient is having underlying posterior tibial tendinitis from severe pes planovalgus I believe patient will benefit from bracing to offload the pressure on the tendon.  I believe patient may benefit from [place] versus [place] brace for posterior tibial tendinitis with dysfunction.  I will have  her follow-up with Liliane Channel for bracing  No follow-ups on file.

## 2020-06-24 ENCOUNTER — Ambulatory Visit (INDEPENDENT_AMBULATORY_CARE_PROVIDER_SITE_OTHER): Payer: Medicare Other | Admitting: Orthotics

## 2020-06-24 ENCOUNTER — Other Ambulatory Visit: Payer: Self-pay

## 2020-06-24 DIAGNOSIS — M779 Enthesopathy, unspecified: Secondary | ICD-10-CM

## 2020-06-24 DIAGNOSIS — Q666 Other congenital valgus deformities of feet: Secondary | ICD-10-CM

## 2020-06-24 DIAGNOSIS — M76822 Posterior tibial tendinitis, left leg: Secondary | ICD-10-CM

## 2020-06-24 NOTE — Progress Notes (Signed)
Patient cast for [place] brace LEFT to address PTTD/Pes planus.  EJ cast with scanner.  Joyce Fields

## 2020-06-30 ENCOUNTER — Ambulatory Visit (INDEPENDENT_AMBULATORY_CARE_PROVIDER_SITE_OTHER): Payer: Medicare Other | Admitting: Podiatry

## 2020-06-30 ENCOUNTER — Encounter: Payer: Self-pay | Admitting: Podiatry

## 2020-06-30 ENCOUNTER — Other Ambulatory Visit: Payer: Self-pay

## 2020-06-30 DIAGNOSIS — B351 Tinea unguium: Secondary | ICD-10-CM

## 2020-06-30 DIAGNOSIS — Q666 Other congenital valgus deformities of feet: Secondary | ICD-10-CM | POA: Diagnosis not present

## 2020-06-30 DIAGNOSIS — M79674 Pain in right toe(s): Secondary | ICD-10-CM | POA: Diagnosis not present

## 2020-06-30 DIAGNOSIS — M76822 Posterior tibial tendinitis, left leg: Secondary | ICD-10-CM

## 2020-06-30 DIAGNOSIS — M79675 Pain in left toe(s): Secondary | ICD-10-CM

## 2020-07-01 NOTE — Progress Notes (Signed)
Subjective:  Patient ID: Joyce Fields, female    DOB: Aug 01, 1946,  MRN: [id number]  Chief Complaint  Patient presents with  . Ankle Pain    "its no better or no worse.  I also need a nail trim today"  . Nail Problem    74 y.o. female presents with the above complaint.  Patient presents with a follow-up of posterior tibial tendinitis.  Patient states the pain is still there the cam boot immobilization does help however she is not able to come out of the cam boot.  She states the injection last time did not help much at all.  The pain came back sooner than expected.  She also would like to have her toenails debrided down as they are thickened elongated dystrophic and painful to touch.  She denies any other acute complaints.   Review of Systems: Negative except as noted in the HPI. Denies N/V/F/Ch.  Past Medical History:  Diagnosis Date  . Allergic rhinitis, cause unspecified   . Allergy   . Anemia   . Anemia, unspecified   . Apnea    CPAP  . Arthritis   . Asthma   . Carpal tunnel syndrome   . Dizziness and giddiness   . Dysmetabolic syndrome X   . GERD (gastroesophageal reflux disease)   . Gout   . Headache(784.0)   . Heart murmur   . Hemorrhoid 2012  . Hyperlipidemia   . Hypertension   . Ingrowing nail   . Migraine   . Morbid obesity ([place])   . Obstructive sleep apnea (adult) (pediatric)   . Osteoarthrosis, unspecified whether generalized or localized, unspecified site   . Other abnormal blood chemistry   . Other abnormal glucose   . Other seborrheic keratosis   . Pain in joint, site unspecified   . Sarcoidosis   . Unspecified hearing loss   . Unspecified hypertrophic and atrophic condition of skin   . Unspecified tinnitus   . Unspecified urinary incontinence   . Vertigo   . Vertigo     Current Outpatient Medications:  .  diclofenac Sodium (VOLTAREN) 1 % GEL, Apply topically., Disp: , Rfl:  .  Semaglutide,0.25 or 0.5MG /DOS, 2 MG/1.5ML SOPN, Inject into the  skin., Disp: , Rfl:  .  acetaminophen (TYLENOL) 325 MG tablet, Take 650 mg by mouth as needed., Disp: , Rfl:  .  albuterol (PROVENTIL HFA;VENTOLIN HFA) 108 (90 BASE) MCG/ACT inhaler, Inhale into the lungs every 6 (six) hours as needed for wheezing or shortness of breath., Disp: , Rfl:  .  allopurinol (ZYLOPRIM) 100 MG tablet, Take 100 mg by mouth daily., Disp: , Rfl:  .  ALPRAZolam (XANAX) 0.5 MG tablet, Take 0.5 mg by mouth at bedtime as needed for anxiety., Disp: , Rfl:  .  amLODipine-olmesartan (AZOR) 5-20 MG per tablet, Take 1 tablet by mouth daily., Disp: , Rfl:  .  aspirin 81 MG tablet, Take 81 mg by mouth daily., Disp: , Rfl:  .  calcitRIOL (ROCALTROL) 0.25 MCG capsule, , Disp: , Rfl:  .  cetirizine (ZYRTEC) 10 MG tablet, Take 1 tablet (10 mg total) by mouth daily., Disp: 30 tablet, Rfl: 5 .  clonazePAM (KLONOPIN) 0.5 MG tablet, Take 0.5 mg by mouth daily as needed., Disp: , Rfl:  .  cyclobenzaprine (FLEXERIL) 5 MG tablet, Take 1-2 tablets (5-10 mg total) by mouth 3 (three) times daily as needed for muscle spasms., Disp: 20 tablet, Rfl: 0 .  desonide (DESOWEN) 0.05 % lotion, Apply topically  2 (two) times daily., Disp: , Rfl:  .  diazepam (VALIUM) 2 MG tablet, TAKE ONE TABLET EVERY SIX (6) HOURS AS NEEDED FOR ANXIETY, Disp: , Rfl:  .  DULoxetine (CYMBALTA) 20 MG capsule, Take 20 mg by mouth., Disp: , Rfl:  .  Ergocalciferol (VITAMIN D2) 2000 UNITS TABS, Take by mouth daily., Disp: , Rfl:  .  FLUoxetine (PROZAC) 40 MG capsule, Take 40 mg by mouth daily., Disp: , Rfl:  .  fluticasone (FLONASE) 50 MCG/ACT nasal spray, Place 2 sprays into both nostrils as needed. , Disp: , Rfl:  .  furosemide (LASIX) 40 MG tablet, Take 1 tablet (40 mg total) by mouth 2 (two) times daily., Disp: 180 tablet, Rfl: 3 .  hydrOXYzine (ATARAX/VISTARIL) 10 MG tablet, Take 10 mg by mouth as needed., Disp: , Rfl:  .  omeprazole (PRILOSEC) 40 MG capsule, Take 40 mg by mouth daily., Disp: , Rfl:  .  ondansetron (ZOFRAN) 8  MG tablet, Take 8 mg by mouth., Disp: , Rfl:  .  polyethylene glycol powder (GLYCOLAX/MIRALAX) 17 GM/SCOOP powder, Take by mouth., Disp: , Rfl:  .  potassium chloride SA (K-DUR,KLOR-CON) 20 MEQ tablet, Take 1 tablet (20 mEq total) by mouth daily., Disp: 90 tablet, Rfl: 3 .  prazosin (MINIPRESS) 1 MG capsule, Take 1 mg by mouth at bedtime., Disp: , Rfl:  .  rosuvastatin (CRESTOR) 20 MG tablet, Take 20 mg by mouth daily., Disp: , Rfl:  .  sodium chloride (OCEAN) 0.65 % nasal spray, 1 spray by Each Nare route daily., Disp: , Rfl:  .  solifenacin (VESICARE) 10 MG tablet, Take 10 mg by mouth daily., Disp: , Rfl:  .  spironolactone (ALDACTONE) 25 MG tablet, Take 25 mg by mouth daily., Disp: , Rfl:  .  topiramate (TOPAMAX) 25 MG tablet, , Disp: , Rfl:  .  venlafaxine XR (EFFEXOR-XR) 150 MG 24 hr capsule, Take 150 mg by mouth daily., Disp: , Rfl:  .  verapamil (CALAN-SR) 120 MG CR tablet, Take 120 mg by mouth daily., Disp: , Rfl:  .  WIXELA INHUB 250-50 MCG/DOSE AEPB, , Disp: , Rfl:   Social History   Tobacco Use  Smoking Status Former Smoker  . Packs/day: 1.00  . Years: 25.00  . Pack years: 25.00  . Types: Cigarettes  Smokeless Tobacco Never Used    Allergies  Allergen Reactions  . Bee Venom Shortness Of Breath    swelling  . Metformin Other (See Comments)    Kidney issues  . Gabapentin Other (See Comments)    Causes constipation  . Ibuprofen     Kidney injury  . Niaspan [Niacin Er] Other (See Comments)    Hot flashes  . Welchol [Colesevelam Hcl] Nausea And Vomiting    indigestion  . Colesevelam Nausea And Vomiting    Indigestion, burning all over   Objective:  There were no vitals filed for this visit. There is no height or weight on file to calculate BMI. Constitutional Well developed. Well nourished.  Vascular Dorsalis pedis pulses palpable bilaterally. Posterior tibial pulses palpable bilaterally. Capillary refill normal to all digits.  No cyanosis or clubbing  noted. Pedal hair growth normal.  Neurologic Normal speech. Oriented to person, place, and time. Epicritic sensation to light touch grossly present bilaterally.  Dermatologic Nail Exam: Pt has thick disfigured discolored nails with subungual debris noted bilateral entire nail hallux through fifth toenails.  Pain on palpation to the nails. No open wounds. No skin lesions.  Orthopedic:  Pain  on palpation along the course of the posterior tibial tendon and as well as his insertion.  Pain with dorsiflexion and eversion of the foot.  Pain with inversion plantarflexion of the foot active and passive.  No pain at the peroneal tendon, ATFL ligament, Achilles tendon.   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 Plantar posterior heel spurring noted.  History of previous fifth metatarsal base fracture noted.  No other osseous abnormalities noted. Assessment:   1. Pes planovalgus   2. Posterior tibial tendinitis of left leg   3. Pain due to onychomycosis of toenails of both feet    Plan:  Patient was evaluated and treated and all questions answered.  Left posterior tibial tendinitis -I explained to the patient the etiology of posterior tibial tendinitis and various treatment options were extensively discussed.  Patient states that she had some improvement but not completely resolved.  I have asked her to continue using the cam boot.  I will hold off on any further injection as the previous injection did not help.  Given that she is still having acute pain Will be to obtain an MRI to evaluate the integrity of the tendon. -I will plan on ordering MRI of the left ankle to evaluate the posterior tibial tendon.  Pes planovalgus with underlying posterior tibial tendinitis -I explained patient the etiology of pes planovalgus and various treatment options were extensively discussed.  Given that patient is having underlying posterior tibial  tendinitis from severe pes planovalgus I believe patient will benefit from bracing to offload the pressure on the tendon.  I believe patient may benefit from [place] versus [place] brace for posterior tibial tendinitis with dysfunction.  -Patient is awaiting miso brace.  Onychomycosis with pain  -Nails palliatively debrided as below. -Educated on self-care  Procedure: Nail Debridement Rationale: pain  Type of Debridement: manual, sharp debridement. Instrumentation: Nail nipper, rotary burr. Number of Nails: 10  Procedures and Treatment: Consent by patient was obtained for treatment procedures. The patient understood the discussion of treatment and procedures well. All questions were answered thoroughly reviewed. Debridement of mycotic and hypertrophic toenails, 1 through 5 bilateral and clearing of subungual debris. No ulceration, no infection noted.  Return Visit-Office Procedure: Patient instructed to return to the office for a follow up visit 3 months for continued evaluation and treatment.  Boneta Lucks, DPM    No follow-ups on file.   No follow-ups on file.

## 2020-07-09 ENCOUNTER — Telehealth: Payer: Self-pay

## 2020-07-09 DIAGNOSIS — M76822 Posterior tibial tendinitis, left leg: Secondary | ICD-10-CM

## 2020-07-09 NOTE — Telephone Encounter (Signed)
Per [facility] and [employer],  No prior authorization required for MRI.

## 2020-07-22 ENCOUNTER — Other Ambulatory Visit: Payer: Medicare Other | Admitting: Orthotics

## 2020-07-26 ENCOUNTER — Ambulatory Visit
Admission: RE | Admit: 2020-07-26 | Discharge: 2020-07-26 | Disposition: A | Discharge status: Home / Self Care | Payer: Medicare Other | Source: Ambulatory Visit | Attending: Podiatry | Admitting: Podiatry

## 2020-07-26 ENCOUNTER — Other Ambulatory Visit: Payer: Self-pay

## 2020-07-26 DIAGNOSIS — M76822 Posterior tibial tendinitis, left leg: Secondary | ICD-10-CM | POA: Diagnosis present

## 2020-07-28 ENCOUNTER — Encounter: Payer: Self-pay | Admitting: Podiatry

## 2020-07-28 ENCOUNTER — Other Ambulatory Visit: Payer: Self-pay

## 2020-07-28 ENCOUNTER — Ambulatory Visit (INDEPENDENT_AMBULATORY_CARE_PROVIDER_SITE_OTHER): Payer: Medicare Other | Admitting: Podiatry

## 2020-07-28 DIAGNOSIS — Q666 Other congenital valgus deformities of feet: Secondary | ICD-10-CM

## 2020-07-28 DIAGNOSIS — M76822 Posterior tibial tendinitis, left leg: Secondary | ICD-10-CM

## 2020-07-28 NOTE — Progress Notes (Signed)
Subjective:  Patient ID: Joyce Fields, female    DOB: June 03, 1946,  MRN: [id number]  Chief Complaint  Patient presents with   Foot Pain    No better    74 y.o. female presents with the above complaint.  Patient presents with follow-up of posterior tibial tendinitis.  Patient states that the cam boot helps a little bit but there is still a lot of pain associated with it.  She has not been able to transition well.  She would like to discuss any further treatment options.  Patient still awaiting the brace   Review of Systems: Negative except as noted in the HPI. Denies N/V/F/Ch.  Past Medical History:  Diagnosis Date   Allergic rhinitis, cause unspecified    Allergy    Anemia    Anemia, unspecified    Apnea    CPAP   Arthritis    Asthma    Carpal tunnel syndrome    Dizziness and giddiness    Dysmetabolic syndrome X    GERD (gastroesophageal reflux disease)    Gout    Headache(784.0)    Heart murmur    Hemorrhoid 2012   Hyperlipidemia    Hypertension    Ingrowing nail    Migraine    Morbid obesity (H[facility])    Obstructive sleep apnea (adult) (pediatric)    Osteoarthrosis, unspecified whether generalized or localized, unspecified site    Other abnormal blood chemistry    Other abnormal glucose    Other seborrheic keratosis    Pain in joint, site unspecified    Sarcoidosis    Unspecified hearing loss    Unspecified hypertrophic and atrophic condition of skin    Unspecified tinnitus    Unspecified urinary incontinence    Vertigo    Vertigo     Current Outpatient Medications:    acetaminophen (TYLENOL) 325 MG tablet, Take 650 mg by mouth as needed., Disp: , Rfl:    albuterol (PROVENTIL HFA;VENTOLIN HFA) 108 (90 BASE) MCG/ACT inhaler, Inhale into the lungs every 6 (six) hours as needed for wheezing or shortness of breath., Disp: , Rfl:    allopurinol (ZYLOPRIM) 100 MG tablet, Take 100 mg by mouth daily., Disp: , Rfl:    ALPRAZolam  (XANAX) 0.5 MG tablet, Take 0.5 mg by mouth at bedtime as needed for anxiety., Disp: , Rfl:    amLODipine-olmesartan (AZOR) 5-20 MG per tablet, Take 1 tablet by mouth daily., Disp: , Rfl:    aspirin 81 MG tablet, Take 81 mg by mouth daily., Disp: , Rfl:    calcitRIOL (ROCALTROL) 0.25 MCG capsule, , Disp: , Rfl:    cetirizine (ZYRTEC) 10 MG tablet, Take 1 tablet (10 mg total) by mouth daily., Disp: 30 tablet, Rfl: 5   clonazePAM (KLONOPIN) 0.5 MG tablet, Take 0.5 mg by mouth daily as needed., Disp: , Rfl:    cyclobenzaprine (FLEXERIL) 5 MG tablet, Take 1-2 tablets (5-10 mg total) by mouth 3 (three) times daily as needed for muscle spasms., Disp: 20 tablet, Rfl: 0   desonide (DESOWEN) 0.05 % lotion, Apply topically 2 (two) times daily., Disp: , Rfl:    diazepam (VALIUM) 2 MG tablet, TAKE ONE TABLET EVERY SIX (6) HOURS AS NEEDED FOR ANXIETY, Disp: , Rfl:    diclofenac Sodium (VOLTAREN) 1 % GEL, Apply topically., Disp: , Rfl:    DULoxetine (CYMBALTA) 20 MG capsule, Take 20 mg by mouth., Disp: , Rfl:    Ergocalciferol (VITAMIN D2) 2000 UNITS TABS, Take by mouth daily., Disp: , Rfl:  FLUoxetine (PROZAC) 40 MG capsule, Take 40 mg by mouth daily., Disp: , Rfl:    fluticasone (FLONASE) 50 MCG/ACT nasal spray, Place 2 sprays into both nostrils as needed. , Disp: , Rfl:    furosemide (LASIX) 40 MG tablet, Take 1 tablet (40 mg total) by mouth 2 (two) times daily., Disp: 180 tablet, Rfl: 3   hydrOXYzine (ATARAX/VISTARIL) 10 MG tablet, Take 10 mg by mouth as needed., Disp: , Rfl:    omeprazole (PRILOSEC) 40 MG capsule, Take 40 mg by mouth daily., Disp: , Rfl:    ondansetron (ZOFRAN) 8 MG tablet, Take 8 mg by mouth., Disp: , Rfl:    polyethylene glycol powder (GLYCOLAX/MIRALAX) 17 GM/SCOOP powder, Take by mouth., Disp: , Rfl:    potassium chloride SA (K-DUR,KLOR-CON) 20 MEQ tablet, Take 1 tablet (20 mEq total) by mouth daily., Disp: 90 tablet, Rfl: 3   prazosin (MINIPRESS) 1 MG capsule,  Take 1 mg by mouth at bedtime., Disp: , Rfl:    rosuvastatin (CRESTOR) 20 MG tablet, Take 20 mg by mouth daily., Disp: , Rfl:    Semaglutide,0.25 or 0.5MG /DOS, 2 MG/1.5ML SOPN, Inject into the skin., Disp: , Rfl:    sodium chloride (OCEAN) 0.65 % nasal spray, 1 spray by Each Nare route daily., Disp: , Rfl:    solifenacin (VESICARE) 10 MG tablet, Take 10 mg by mouth daily., Disp: , Rfl:    spironolactone (ALDACTONE) 25 MG tablet, Take 25 mg by mouth daily., Disp: , Rfl:    topiramate (TOPAMAX) 25 MG tablet, , Disp: , Rfl:    venlafaxine XR (EFFEXOR-XR) 150 MG 24 hr capsule, Take 150 mg by mouth daily., Disp: , Rfl:    verapamil (CALAN-SR) 120 MG CR tablet, Take 120 mg by mouth daily., Disp: , Rfl:    WIXELA INHUB 250-50 MCG/DOSE AEPB, , Disp: , Rfl:   Social History   Tobacco Use  Smoking Status Former Smoker   Packs/day: 1.00   Years: 25.00   Pack years: 25.00   Types: Cigarettes  Smokeless Tobacco Never Used    Allergies  Allergen Reactions   Bee Venom Shortness Of Breath    swelling   Metformin Other (See Comments)    Kidney issues   Gabapentin Other (See Comments)    Causes constipation   Ibuprofen     Kidney injury   Niaspan [Niacin Er] Other (See Comments)    Hot flashes   Welchol [Colesevelam Hcl] Nausea And Vomiting    indigestion   Colesevelam Nausea And Vomiting    Indigestion, burning all over   Objective:  There were no vitals filed for this visit. There is no height or weight on file to calculate BMI. Constitutional Well developed. Well nourished.  Vascular Dorsalis pedis pulses palpable bilaterally. Posterior tibial pulses palpable bilaterally. Capillary refill normal to all digits.  No cyanosis or clubbing noted. Pedal hair growth normal.  Neurologic Normal speech. Oriented to person, place, and time. Epicritic sensation to light touch grossly present bilaterally.  Dermatologic Nail Exam: Pt has thick disfigured discolored nails  with subungual debris noted bilateral entire nail hallux through fifth toenails.  Pain on palpation to the nails. No open wounds. No skin lesions.  Orthopedic:  Pain on palpation along the course of the posterior tibial tendon and as well as his insertion.  Pain with dorsiflexion and eversion of the foot.  Pain with inversion plantarflexion of the foot active and passive.  No pain at the peroneal tendon, ATFL ligament, Achilles tendon.  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 Plantar posterior heel spurring noted.  History of previous fifth metatarsal base fracture noted.  No other osseous abnormalities noted. Assessment:   1. Pes planovalgus   2. Posterior tibial tendinitis of left leg    Plan:  Patient was evaluated and treated and all questions answered.  Left posterior tibial tendinitis -I explained to the patient the etiology of posterior tibial tendinitis and various treatment options were extensively discussed.  Patient states that she had some improvement but not completely resolved.  I have asked her to continue using the cam boot.  I will hold off on any further injection as the previous injection did not help.  Given that she is still having acute pain -MRI was reviewed with the patient in extensive detail.  At this time the MRI was not significant for any type of injury to the medial side or along the course of posterior tibial tendon even though clinically she has acute amount of pain associated with it. -Continue wearing cam boot -Physical therapy prescription was sent for general deconditioning as well as evaluation and management with strengthening of the lower extremity bilaterally.  Pes planovalgus with underlying posterior tibial tendinitis -I explained patient the etiology of pes planovalgus and various treatment options were extensively discussed.  Given that patient is having underlying posterior tibial  tendinitis from severe pes planovalgus I believe patient will benefit from bracing to offload the pressure on the tendon.  I believe patient may benefit from [place] versus [place] brace for posterior tibial tendinitis with dysfunction.  -Patient is awaiting miso brace.  Boneta Lucks, DPM    No follow-ups on file.   No follow-ups on file.

## 2020-08-05 ENCOUNTER — Other Ambulatory Visit: Payer: Self-pay

## 2020-08-05 ENCOUNTER — Ambulatory Visit (INDEPENDENT_AMBULATORY_CARE_PROVIDER_SITE_OTHER): Payer: Medicare Other | Admitting: Orthotics

## 2020-08-05 DIAGNOSIS — M76822 Posterior tibial tendinitis, left leg: Secondary | ICD-10-CM

## 2020-08-05 DIAGNOSIS — Q666 Other congenital valgus deformities of feet: Secondary | ICD-10-CM

## 2020-08-05 NOTE — Progress Notes (Signed)
Patient came in today to pick up standard mezzo AFO.  Patient was evaluated for fit and function.   The brace fit very well and there were any complaints of the way it felt once donned.  The brace offered ankle stability in both saggital and coroneal planes.  Patient advised to always wear proper fitting shoes with brace.

## 2020-08-25 ENCOUNTER — Telehealth: Payer: Self-pay | Admitting: Podiatry

## 2020-08-25 NOTE — Telephone Encounter (Signed)
If the brace haven't helped there might be too much damage and we can discuss surgery. She can move up her appointment

## 2020-08-25 NOTE — Telephone Encounter (Signed)
Received message forwarded by [place] nurse from 10.28. Pt left message stating the brace she got is causing pain and soreness when she wears it and is in therapy. She wants to know if she should keep wearing it or just proceed with surgery. She did go purchase a new pair of shoes thinking that may be the cause but they have not helped..please advise

## 2020-08-25 NOTE — Telephone Encounter (Signed)
Left message for pt to call to move appt sooner in [place] office. Possibly 11.11.

## 2020-09-08 ENCOUNTER — Ambulatory Visit (INDEPENDENT_AMBULATORY_CARE_PROVIDER_SITE_OTHER): Payer: Medicare Other | Admitting: Podiatry

## 2020-09-08 ENCOUNTER — Encounter: Payer: Self-pay | Admitting: Podiatry

## 2020-09-08 ENCOUNTER — Other Ambulatory Visit: Payer: Self-pay

## 2020-09-08 DIAGNOSIS — Q666 Other congenital valgus deformities of feet: Secondary | ICD-10-CM

## 2020-09-08 DIAGNOSIS — M76822 Posterior tibial tendinitis, left leg: Secondary | ICD-10-CM

## 2020-09-08 NOTE — Progress Notes (Signed)
Subjective:  Patient ID: Joyce Fields, female    DOB: [birth year],  MRN: [id number]  No chief complaint on file.   74 y.o. female presents with the above complaint.  Patient presents with follow-up of posterior tibial tendinitis.  Patient states that the cam boot helps a little bit but there is still a lot of pain associated with it. She has not been able to tolerate the measle brace at all. She has been putting herself back in the boot. She would like to discuss surgical option.   Review of Systems: Negative except as noted in the HPI. Denies N/V/F/Ch.  Past Medical History:  Diagnosis Date  . Allergic rhinitis, cause unspecified   . Allergy   . Anemia   . Anemia, unspecified   . Apnea    CPAP  . Arthritis   . Asthma   . Carpal tunnel syndrome   . Dizziness and giddiness   . Dysmetabolic syndrome X   . GERD (gastroesophageal reflux disease)   . Gout   . Headache(784.0)   . Heart murmur   . Hemorrhoid 2012  . Hyperlipidemia   . Hypertension   . Ingrowing nail   . Migraine   . Morbid obesity ([place])   . Obstructive sleep apnea (adult) (pediatric)   . Osteoarthrosis, unspecified whether generalized or localized, unspecified site   . Other abnormal blood chemistry   . Other abnormal glucose   . Other seborrheic keratosis   . Pain in joint, site unspecified   . Sarcoidosis   . Unspecified hearing loss   . Unspecified hypertrophic and atrophic condition of skin   . Unspecified tinnitus   . Unspecified urinary incontinence   . Vertigo   . Vertigo     Current Outpatient Medications:  .  acetaminophen (TYLENOL) 325 MG tablet, Take 650 mg by mouth as needed., Disp: , Rfl:  .  albuterol (PROVENTIL HFA;VENTOLIN HFA) 108 (90 BASE) MCG/ACT inhaler, Inhale into the lungs every 6 (six) hours as needed for wheezing or shortness of breath., Disp: , Rfl:  .  allopurinol (ZYLOPRIM) 100 MG tablet, Take 100 mg by mouth daily., Disp: , Rfl:  .  ALPRAZolam (XANAX) 0.5 MG tablet, Take  0.5 mg by mouth at bedtime as needed for anxiety., Disp: , Rfl:  .  amLODipine-olmesartan (AZOR) 5-20 MG per tablet, Take 1 tablet by mouth daily., Disp: , Rfl:  .  aspirin 81 MG tablet, Take 81 mg by mouth daily., Disp: , Rfl:  .  calcitRIOL (ROCALTROL) 0.25 MCG capsule, , Disp: , Rfl:  .  cetirizine (ZYRTEC) 10 MG tablet, Take 1 tablet (10 mg total) by mouth daily., Disp: 30 tablet, Rfl: 5 .  clonazePAM (KLONOPIN) 0.5 MG tablet, Take 0.5 mg by mouth daily as needed., Disp: , Rfl:  .  cyclobenzaprine (FLEXERIL) 5 MG tablet, Take 1-2 tablets (5-10 mg total) by mouth 3 (three) times daily as needed for muscle spasms., Disp: 20 tablet, Rfl: 0 .  desonide (DESOWEN) 0.05 % lotion, Apply topically 2 (two) times daily., Disp: , Rfl:  .  diazepam (VALIUM) 2 MG tablet, TAKE ONE TABLET EVERY SIX (6) HOURS AS NEEDED FOR ANXIETY, Disp: , Rfl:  .  diclofenac Sodium (VOLTAREN) 1 % GEL, Apply topically., Disp: , Rfl:  .  DULoxetine (CYMBALTA) 20 MG capsule, Take 20 mg by mouth., Disp: , Rfl:  .  Ergocalciferol (VITAMIN D2) 2000 UNITS TABS, Take by mouth daily., Disp: , Rfl:  .  FLUoxetine (PROZAC) 40 MG capsule,  Take 40 mg by mouth daily., Disp: , Rfl:  .  fluticasone (FLONASE) 50 MCG/ACT nasal spray, Place 2 sprays into both nostrils as needed. , Disp: , Rfl:  .  furosemide (LASIX) 40 MG tablet, Take 1 tablet (40 mg total) by mouth 2 (two) times daily., Disp: 180 tablet, Rfl: 3 .  hydrOXYzine (ATARAX/VISTARIL) 10 MG tablet, Take 10 mg by mouth as needed., Disp: , Rfl:  .  omeprazole (PRILOSEC) 40 MG capsule, Take 40 mg by mouth daily., Disp: , Rfl:  .  ondansetron (ZOFRAN) 8 MG tablet, Take 8 mg by mouth., Disp: , Rfl:  .  polyethylene glycol powder (GLYCOLAX/MIRALAX) 17 GM/SCOOP powder, Take by mouth., Disp: , Rfl:  .  potassium chloride SA (K-DUR,KLOR-CON) 20 MEQ tablet, Take 1 tablet (20 mEq total) by mouth daily., Disp: 90 tablet, Rfl: 3 .  prazosin (MINIPRESS) 1 MG capsule, Take 1 mg by mouth at  bedtime., Disp: , Rfl:  .  rosuvastatin (CRESTOR) 20 MG tablet, Take 20 mg by mouth daily., Disp: , Rfl:  .  Semaglutide,0.25 or 0.5MG /DOS, 2 MG/1.5ML SOPN, Inject into the skin., Disp: , Rfl:  .  sodium chloride (OCEAN) 0.65 % nasal spray, 1 spray by Each Nare route daily., Disp: , Rfl:  .  solifenacin (VESICARE) 10 MG tablet, Take 10 mg by mouth daily., Disp: , Rfl:  .  spironolactone (ALDACTONE) 25 MG tablet, Take 25 mg by mouth daily., Disp: , Rfl:  .  topiramate (TOPAMAX) 25 MG tablet, , Disp: , Rfl:  .  venlafaxine XR (EFFEXOR-XR) 150 MG 24 hr capsule, Take 150 mg by mouth daily., Disp: , Rfl:  .  verapamil (CALAN-SR) 120 MG CR tablet, Take 120 mg by mouth daily., Disp: , Rfl:  .  WIXELA INHUB 250-50 MCG/DOSE AEPB, , Disp: , Rfl:   Social History   Tobacco Use  Smoking Status Former Smoker  . Packs/day: 1.00  . Years: 25.00  . Pack years: 25.00  . Types: Cigarettes  Smokeless Tobacco Never Used    Allergies  Allergen Reactions  . Bee Venom Shortness Of Breath    swelling  . Metformin Other (See Comments)    Kidney issues  . Gabapentin Other (See Comments)    Causes constipation  . Ibuprofen     Kidney injury  . Niaspan [Niacin Er] Other (See Comments)    Hot flashes  . Welchol [Colesevelam Hcl] Nausea And Vomiting    indigestion  . Colesevelam Nausea And Vomiting    Indigestion, burning all over   Objective:  There were no vitals filed for this visit. There is no height or weight on file to calculate BMI. Constitutional Well developed. Well nourished.  Vascular Dorsalis pedis pulses palpable bilaterally. Posterior tibial pulses palpable bilaterally. Capillary refill normal to all digits.  No cyanosis or clubbing noted. Pedal hair growth normal.  Neurologic Normal speech. Oriented to person, place, and time. Epicritic sensation to light touch grossly present bilaterally.  Dermatologic Nail Exam: Pt has thick disfigured discolored nails with subungual debris  noted bilateral entire nail hallux through fifth toenails.  Pain on palpation to the nails. No open wounds. No skin lesions.  Orthopedic:  Pain on palpation along the course of the posterior tibial tendon and as well as his insertion.  Pain with dorsiflexion and eversion of the foot.  Pain with inversion plantarflexion of the foot active and passive.  No pain at the peroneal tendon, ATFL ligament, Achilles tendon.   Radiographs: 3 views of  skeletally mature adult left foot: Severe arthrosis noted in the dorsal midfoot. Plantar posterior heel spurring noted.  History of previous fifth metatarsal base fracture noted.  No other osseous abnormalities noted. Assessment:   1. Pes planovalgus   2. Posterior tibial tendinitis of left leg    Plan:  Patient was evaluated and treated and all questions answered.  Left posterior tibial tendinitis -I explained to the patient the etiology of posterior tibial tendinitis and various treatment options were extensively discussed.  Patient states that she had some improvement but not completely resolved.  I have asked her to continue using the cam boot.  I will hold off on any further injection as the previous injection did not help.  Given that she is still having acute pain -MRI was reviewed with the patient in extensive detail.  At this time the MRI was not significant for any type of injury to the medial side or along the course of posterior tibial tendon even though clinically she has acute amount of pain associated with it. -Continue wearing cam boot -Physical therapy has not been helpful. She will continue physical therapy for now. -I discussed with her the options of surgical treatment option given that patient has failed all conservative treatment option even though MRI was negative clinically I believe that there is involvement of the tendon as well as insertion into the bone as she is having pain along the course of the entire tendon. Minimal patient will  benefit from tenosynovectomy. Patient agrees with the plan would like to think about the surgery I will see her back again in about 2 months and will schedule for surgery.  Pes planovalgus with underlying posterior tibial tendinitis -I explained patient the etiology of pes planovalgus and various treatment options were extensively discussed.  Given that patient is having underlying posterior tibial tendinitis from severe pes planovalgus I believe patient will benefit from bracing to offload the pressure on the tendon.  I believe patient may benefit from [place] versus [place] brace for posterior tibial tendinitis with dysfunction.  -Continue wearing measle brace as tolerated.  Boneta Lucks, DPM    No follow-ups on file.   No follow-ups on file.

## 2020-10-20 ENCOUNTER — Ambulatory Visit: Payer: Medicare Other | Admitting: Podiatry

## 2020-11-03 ENCOUNTER — Ambulatory Visit: Payer: Medicare Other | Admitting: Podiatry

## 2020-11-10 ENCOUNTER — Ambulatory Visit (INDEPENDENT_AMBULATORY_CARE_PROVIDER_SITE_OTHER): Payer: Medicare Other | Admitting: Podiatry

## 2020-11-10 ENCOUNTER — Other Ambulatory Visit: Payer: Self-pay

## 2020-11-10 ENCOUNTER — Encounter: Payer: Self-pay | Admitting: Podiatry

## 2020-11-10 DIAGNOSIS — M76822 Posterior tibial tendinitis, left leg: Secondary | ICD-10-CM | POA: Diagnosis not present

## 2020-11-10 DIAGNOSIS — B351 Tinea unguium: Secondary | ICD-10-CM

## 2020-11-10 DIAGNOSIS — M79675 Pain in left toe(s): Secondary | ICD-10-CM | POA: Diagnosis not present

## 2020-11-10 DIAGNOSIS — M79674 Pain in right toe(s): Secondary | ICD-10-CM

## 2020-11-10 NOTE — Progress Notes (Signed)
Subjective:  Patient ID: Joyce Fields, female    DOB: [birth year],  MRN: [id number]  No chief complaint on file.   75 y.o. female presents with the above complaint.  Patient presents with follow-up of posterior tibial tendinitis.  Patient states that the cam boot helps a little bit but there is still a lot of pain associated with it. She has not been able to tolerate the measle brace at all. She has been putting herself back in the boot. She would like to discuss surgical option.   Review of Systems: Negative except as noted in the HPI. Denies N/V/F/Ch.  Past Medical History:  Diagnosis Date  . Allergic rhinitis, cause unspecified   . Allergy   . Anemia   . Anemia, unspecified   . Apnea    CPAP  . Arthritis   . Asthma   . Carpal tunnel syndrome   . Dizziness and giddiness   . Dysmetabolic syndrome X   . GERD (gastroesophageal reflux disease)   . Gout   . Headache(784.0)   . Heart murmur   . Hemorrhoid 2012  . Hyperlipidemia   . Hypertension   . Ingrowing nail   . Migraine   . Morbid obesity ([place])   . Obstructive sleep apnea (adult) (pediatric)   . Osteoarthrosis, unspecified whether generalized or localized, unspecified site   . Other abnormal blood chemistry   . Other abnormal glucose   . Other seborrheic keratosis   . Pain in joint, site unspecified   . Sarcoidosis   . Unspecified hearing loss   . Unspecified hypertrophic and atrophic condition of skin   . Unspecified tinnitus   . Unspecified urinary incontinence   . Vertigo   . Vertigo     Current Outpatient Medications:  .  acetaminophen (TYLENOL) 325 MG tablet, Take 650 mg by mouth as needed., Disp: , Rfl:  .  albuterol (PROVENTIL HFA;VENTOLIN HFA) 108 (90 BASE) MCG/ACT inhaler, Inhale into the lungs every 6 (six) hours as needed for wheezing or shortness of breath., Disp: , Rfl:  .  allopurinol (ZYLOPRIM) 100 MG tablet, Take 100 mg by mouth daily., Disp: , Rfl:  .  ALPRAZolam (XANAX) 0.5 MG tablet, Take  0.5 mg by mouth at bedtime as needed for anxiety., Disp: , Rfl:  .  amLODipine-olmesartan (AZOR) 5-20 MG per tablet, Take 1 tablet by mouth daily., Disp: , Rfl:  .  aspirin 81 MG tablet, Take 81 mg by mouth daily., Disp: , Rfl:  .  calcitRIOL (ROCALTROL) 0.25 MCG capsule, , Disp: , Rfl:  .  cetirizine (ZYRTEC) 10 MG tablet, Take 1 tablet (10 mg total) by mouth daily., Disp: 30 tablet, Rfl: 5 .  clonazePAM (KLONOPIN) 0.5 MG tablet, Take 0.5 mg by mouth daily as needed., Disp: , Rfl:  .  cyclobenzaprine (FLEXERIL) 5 MG tablet, Take 1-2 tablets (5-10 mg total) by mouth 3 (three) times daily as needed for muscle spasms., Disp: 20 tablet, Rfl: 0 .  desonide (DESOWEN) 0.05 % lotion, Apply topically 2 (two) times daily., Disp: , Rfl:  .  diazepam (VALIUM) 2 MG tablet, TAKE ONE TABLET EVERY SIX (6) HOURS AS NEEDED FOR ANXIETY, Disp: , Rfl:  .  diclofenac Sodium (VOLTAREN) 1 % GEL, Apply topically., Disp: , Rfl:  .  DULoxetine (CYMBALTA) 20 MG capsule, Take 20 mg by mouth., Disp: , Rfl:  .  Ergocalciferol (VITAMIN D2) 2000 UNITS TABS, Take by mouth daily., Disp: , Rfl:  .  FLUoxetine (PROZAC) 40 MG capsule,  Take 40 mg by mouth daily., Disp: , Rfl:  .  fluticasone (FLONASE) 50 MCG/ACT nasal spray, Place 2 sprays into both nostrils as needed. , Disp: , Rfl:  .  furosemide (LASIX) 40 MG tablet, Take 1 tablet (40 mg total) by mouth 2 (two) times daily., Disp: 180 tablet, Rfl: 3 .  hydrOXYzine (ATARAX/VISTARIL) 10 MG tablet, Take 10 mg by mouth as needed., Disp: , Rfl:  .  omeprazole (PRILOSEC) 40 MG capsule, Take 40 mg by mouth daily., Disp: , Rfl:  .  ondansetron (ZOFRAN) 8 MG tablet, Take 8 mg by mouth., Disp: , Rfl:  .  polyethylene glycol powder (GLYCOLAX/MIRALAX) 17 GM/SCOOP powder, Take by mouth., Disp: , Rfl:  .  potassium chloride SA (K-DUR,KLOR-CON) 20 MEQ tablet, Take 1 tablet (20 mEq total) by mouth daily., Disp: 90 tablet, Rfl: 3 .  prazosin (MINIPRESS) 1 MG capsule, Take 1 mg by mouth at  bedtime., Disp: , Rfl:  .  rosuvastatin (CRESTOR) 20 MG tablet, Take 20 mg by mouth daily., Disp: , Rfl:  .  Semaglutide,0.25 or 0.5MG /DOS, 2 MG/1.5ML SOPN, Inject into the skin., Disp: , Rfl:  .  sodium chloride (OCEAN) 0.65 % nasal spray, 1 spray by Each Nare route daily., Disp: , Rfl:  .  solifenacin (VESICARE) 10 MG tablet, Take 10 mg by mouth daily., Disp: , Rfl:  .  spironolactone (ALDACTONE) 25 MG tablet, Take 25 mg by mouth daily., Disp: , Rfl:  .  topiramate (TOPAMAX) 25 MG tablet, , Disp: , Rfl:  .  venlafaxine XR (EFFEXOR-XR) 150 MG 24 hr capsule, Take 150 mg by mouth daily., Disp: , Rfl:  .  verapamil (CALAN-SR) 120 MG CR tablet, Take 120 mg by mouth daily., Disp: , Rfl:  .  WIXELA INHUB 250-50 MCG/DOSE AEPB, , Disp: , Rfl:   Social History   Tobacco Use  Smoking Status Former Smoker  . Packs/day: 1.00  . Years: 25.00  . Pack years: 25.00  . Types: Cigarettes  Smokeless Tobacco Never Used    Allergies  Allergen Reactions  . Bee Venom Shortness Of Breath    swelling  . Metformin Other (See Comments)    Kidney issues  . Gabapentin Other (See Comments)    Causes constipation  . Ibuprofen     Kidney injury  . Niaspan [Niacin Er] Other (See Comments)    Hot flashes  . Welchol [Colesevelam Hcl] Nausea And Vomiting    indigestion  . Colesevelam Nausea And Vomiting    Indigestion, burning all over   Objective:  There were no vitals filed for this visit. There is no height or weight on file to calculate BMI. Constitutional Well developed. Well nourished.  Vascular Dorsalis pedis pulses palpable bilaterally. Posterior tibial pulses palpable bilaterally. Capillary refill normal to all digits.  No cyanosis or clubbing noted. Pedal hair growth normal.  Neurologic Normal speech. Oriented to person, place, and time. Epicritic sensation to light touch grossly present bilaterally.  Dermatologic Nail Exam: Pt has thick disfigured discolored nails with subungual debris  noted bilateral entire nail hallux through fifth toenails.  Pain on palpation to the nails. Except left hallux. No nail noted to the left hallux No open wounds. No skin lesions.  Orthopedic:  Pain on palpation along the course of the posterior tibial tendon and as well as his insertion.  Pain with dorsiflexion and eversion of the foot.  Pain with inversion plantarflexion of the foot active and passive.  No pain at the peroneal tendon,  ATFL ligament, Achilles tendon.   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 Plantar posterior heel spurring noted.  History of previous fifth metatarsal base fracture noted.  No other osseous abnormalities noted. Assessment:   1. Posterior tibial tendinitis of left leg   2. Pain due to onychomycosis of toenails of both feet    Plan:  Patient was evaluated and treated and all questions answered.  Onychomycosis with pain  -Nails palliatively debrided as below. -Educated on self-care  Procedure: Nail Debridement Rationale: pain  Type of Debridement: manual, sharp debridement. Instrumentation: Nail nipper, rotary burr. Number of Nails: 9  Procedures and Treatment: Consent by patient was obtained for treatment procedures. The patient understood the discussion of treatment and procedures well. All questions were answered thoroughly reviewed. Debridement of mycotic and hypertrophic toenails, 1 through 5 bilateral and clearing of subungual debris. No ulceration, no infection noted.  Return Visit-Office Procedure: Patient instructed to return to the office for a follow up visit 3 months for continued evaluation and treatment.  Left posterior tibial tendinitis -I explained to the patient the etiology of posterior tibial tendinitis and various treatment options were extensively discussed.  Patient states that she had some improvement but not completely resolved.  I have asked her to continue using the cam  boot.  I will hold off on any further injection as the previous injection did not help.  Given that she is still having acute pain -MRI was reviewed with the patient in extensive detail.  At this time the MRI was not significant for any type of injury to the medial side or along the course of posterior tibial tendon even though clinically she has acute amount of pain associated with it. -Continue wearing cam boot -Physical therapy has not been helpful. She will continue physical therapy for now. -I discussed with her the options of surgical treatment option given that patient has failed all conservative treatment option even though MRI was negative clinically I believe that there is involvement of the tendon as well as insertion into the bone as she is having pain along the course of the entire tendon. Minimal patient will benefit from tenosynovectomy. Patient agrees with the plan would like to think about the surgery I will see her back again in about 2 months and will schedule for surgery.  Pes planovalgus with underlying posterior tibial tendinitis -I explained patient the etiology of pes planovalgus and various treatment options were extensively discussed.  Given that patient is having underlying posterior tibial tendinitis from severe pes planovalgus I believe patient will benefit from bracing to offload the pressure on the tendon.  I believe patient may benefit from [place] versus [place] brace for posterior tibial tendinitis with dysfunction.  -Continue wearing measle brace as tolerated.  Boneta Lucks, DPM    No follow-ups on file.   No follow-ups on file.

## 2021-02-09 ENCOUNTER — Other Ambulatory Visit: Payer: Self-pay

## 2021-02-09 ENCOUNTER — Encounter: Payer: Self-pay | Admitting: Podiatry

## 2021-02-09 ENCOUNTER — Ambulatory Visit (INDEPENDENT_AMBULATORY_CARE_PROVIDER_SITE_OTHER): Payer: Medicare Other | Admitting: Podiatry

## 2021-02-09 DIAGNOSIS — M76822 Posterior tibial tendinitis, left leg: Secondary | ICD-10-CM

## 2021-02-09 DIAGNOSIS — M79674 Pain in right toe(s): Secondary | ICD-10-CM | POA: Diagnosis not present

## 2021-02-09 DIAGNOSIS — B351 Tinea unguium: Secondary | ICD-10-CM

## 2021-02-09 DIAGNOSIS — M79675 Pain in left toe(s): Secondary | ICD-10-CM | POA: Diagnosis not present

## 2021-02-11 ENCOUNTER — Encounter: Payer: Self-pay | Admitting: Podiatry

## 2021-02-11 NOTE — Progress Notes (Signed)
Subjective:  Patient ID: Joyce Fields, female    DOB: 1945-12-10,  MRN: [id number]  Chief Complaint  Patient presents with  . Nail Problem    Patient request a nail trim today.  She says her foot still hurts but does not want surgery    75 y.o. female presents with the above complaint.  Patient presents with follow-up of posterior tibial tendinitis.  She states that the foot is doing little bit better.  She has been wearing the brace and has been managing it.  She does not want any surgeries.  She is here for thickened elongated dystrophic toenails x10 she would like to have them debrided down.  She denies any other acute complaints.   Review of Systems: Negative except as noted in the HPI. Denies N/V/F/Ch.  Past Medical History:  Diagnosis Date  . Allergic rhinitis, cause unspecified   . Allergy   . Anemia   . Anemia, unspecified   . Apnea    CPAP  . Arthritis   . Asthma   . Carpal tunnel syndrome   . Dizziness and giddiness   . Dysmetabolic syndrome X   . GERD (gastroesophageal reflux disease)   . Gout   . Headache(784.0)   . Heart murmur   . Hemorrhoid 2012  . Hyperlipidemia   . Hypertension   . Ingrowing nail   . Migraine   . Morbid obesity ([facility])   . Obstructive sleep apnea (adult) (pediatric)   . Osteoarthrosis, unspecified whether generalized or localized, unspecified site   . Other abnormal blood chemistry   . Other abnormal glucose   . Other seborrheic keratosis   . Pain in joint, site unspecified   . Sarcoidosis   . Unspecified hearing loss   . Unspecified hypertrophic and atrophic condition of skin   . Unspecified tinnitus   . Unspecified urinary incontinence   . Vertigo   . Vertigo     Current Outpatient Medications:  .  acetaminophen (TYLENOL) 325 MG tablet, Take 650 mg by mouth as needed., Disp: , Rfl:  .  albuterol (PROVENTIL HFA;VENTOLIN HFA) 108 (90 BASE) MCG/ACT inhaler, Inhale into the lungs every 6 (six) hours as needed for wheezing or  shortness of breath., Disp: , Rfl:  .  allopurinol (ZYLOPRIM) 100 MG tablet, Take 100 mg by mouth daily., Disp: , Rfl:  .  ALPRAZolam (XANAX) 0.5 MG tablet, Take 0.5 mg by mouth at bedtime as needed for anxiety., Disp: , Rfl:  .  amLODipine-olmesartan (AZOR) 5-20 MG per tablet, Take 1 tablet by mouth daily., Disp: , Rfl:  .  aspirin 81 MG tablet, Take 81 mg by mouth daily., Disp: , Rfl:  .  calcitRIOL (ROCALTROL) 0.25 MCG capsule, , Disp: , Rfl:  .  cetirizine (ZYRTEC) 10 MG tablet, Take 1 tablet (10 mg total) by mouth daily., Disp: 30 tablet, Rfl: 5 .  clonazePAM (KLONOPIN) 0.5 MG tablet, Take 0.5 mg by mouth daily as needed., Disp: , Rfl:  .  cyclobenzaprine (FLEXERIL) 5 MG tablet, Take 1-2 tablets (5-10 mg total) by mouth 3 (three) times daily as needed for muscle spasms., Disp: 20 tablet, Rfl: 0 .  desonide (DESOWEN) 0.05 % lotion, Apply topically 2 (two) times daily., Disp: , Rfl:  .  diazepam (VALIUM) 2 MG tablet, TAKE ONE TABLET EVERY SIX (6) HOURS AS NEEDED FOR ANXIETY, Disp: , Rfl:  .  DULoxetine (CYMBALTA) 20 MG capsule, Take 20 mg by mouth., Disp: , Rfl:  .  Ergocalciferol (VITAMIN D2)  2000 UNITS TABS, Take by mouth daily., Disp: , Rfl:  .  FLUoxetine (PROZAC) 40 MG capsule, Take 40 mg by mouth daily., Disp: , Rfl:  .  fluticasone (FLONASE) 50 MCG/ACT nasal spray, Place 2 sprays into both nostrils as needed. , Disp: , Rfl:  .  furosemide (LASIX) 40 MG tablet, Take 1 tablet (40 mg total) by mouth 2 (two) times daily., Disp: 180 tablet, Rfl: 3 .  hydrOXYzine (ATARAX/VISTARIL) 10 MG tablet, Take 10 mg by mouth as needed., Disp: , Rfl:  .  omeprazole (PRILOSEC) 40 MG capsule, Take 40 mg by mouth daily., Disp: , Rfl:  .  ondansetron (ZOFRAN) 8 MG tablet, Take 8 mg by mouth., Disp: , Rfl:  .  polyethylene glycol powder (GLYCOLAX/MIRALAX) 17 GM/SCOOP powder, Take by mouth., Disp: , Rfl:  .  potassium chloride SA (K-DUR,KLOR-CON) 20 MEQ tablet, Take 1 tablet (20 mEq total) by mouth daily.,  Disp: 90 tablet, Rfl: 3 .  prazosin (MINIPRESS) 1 MG capsule, Take 1 mg by mouth at bedtime., Disp: , Rfl:  .  rosuvastatin (CRESTOR) 20 MG tablet, Take 20 mg by mouth daily., Disp: , Rfl:  .  Semaglutide,0.25 or 0.5MG /DOS, 2 MG/1.5ML SOPN, Inject into the skin., Disp: , Rfl:  .  sodium chloride (OCEAN) 0.65 % nasal spray, 1 spray by Each Nare route daily., Disp: , Rfl:  .  solifenacin (VESICARE) 10 MG tablet, Take 10 mg by mouth daily., Disp: , Rfl:  .  spironolactone (ALDACTONE) 25 MG tablet, Take 25 mg by mouth daily., Disp: , Rfl:  .  topiramate (TOPAMAX) 25 MG tablet, , Disp: , Rfl:  .  venlafaxine XR (EFFEXOR-XR) 150 MG 24 hr capsule, Take 150 mg by mouth daily., Disp: , Rfl:  .  verapamil (CALAN-SR) 120 MG CR tablet, Take 120 mg by mouth daily., Disp: , Rfl:  .  WIXELA INHUB 250-50 MCG/DOSE AEPB, , Disp: , Rfl:   Social History   Tobacco Use  Smoking Status Former Smoker  . Packs/day: 1.00  . Years: 25.00  . Pack years: 25.00  . Types: Cigarettes  Smokeless Tobacco Never Used    Allergies  Allergen Reactions  . Bee Venom Shortness Of Breath    swelling  . Metformin Other (See Comments)    Kidney issues  . Gabapentin Other (See Comments)    Causes constipation  . Ibuprofen     Kidney injury  . Niaspan [Niacin Er] Other (See Comments)    Hot flashes  . Welchol [Colesevelam Hcl] Nausea And Vomiting    indigestion  . Colesevelam Nausea And Vomiting    Indigestion, burning all over   Objective:  There were no vitals filed for this visit. There is no height or weight on file to calculate BMI. Constitutional Well developed. Well nourished.  Vascular Dorsalis pedis pulses palpable bilaterally. Posterior tibial pulses palpable bilaterally. Capillary refill normal to all digits.  No cyanosis or clubbing noted. Pedal hair growth normal.  Neurologic Normal speech. Oriented to person, place, and time. Epicritic sensation to light touch grossly present bilaterally.   Dermatologic Nail Exam: Pt has thick disfigured discolored nails with subungual debris noted bilateral entire nail hallux through fifth toenails.  Pain on palpation to the nails. Except left hallux. No nail noted to the left hallux No open wounds. No skin lesions.  Orthopedic:  Mild pain on palpation along the course of the posterior tibial tendon and as well as his insertion.  Mild pain with dorsiflexion and eversion of  the foot.  Mild pain with inversion plantarflexion of the foot active and passive.  No pain at the peroneal tendon, ATFL ligament, Achilles tendon.   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 Plantar posterior heel spurring noted.  History of previous fifth metatarsal base fracture noted.  No other osseous abnormalities noted. Assessment:   1. Pain due to onychomycosis of toenails of both feet   2. Posterior tibial tendinitis of left leg    Plan:  Patient was evaluated and treated and all questions answered.  Onychomycosis with pain  -Nails palliatively debrided as below. -Educated on self-care  Procedure: Nail Debridement Rationale: pain  Type of Debridement: manual, sharp debridement. Instrumentation: Nail nipper, rotary burr. Number of Nails: 9  Procedures and Treatment: Consent by patient was obtained for treatment procedures. The patient understood the discussion of treatment and procedures well. All questions were answered thoroughly reviewed. Debridement of mycotic and hypertrophic toenails, 1 through 5 bilateral and clearing of subungual debris. No ulceration, no infection noted.  Return Visit-Office Procedure: Patient instructed to return to the office for a follow up visit 3 months for continued evaluation and treatment.  Left posterior tibial tendinitis -Clinically doing better.  She would like to hold off on the surgery even though the MRI is positive for bad posterior tibial tendinitis.  Pes  planovalgus with underlying posterior tibial tendinitis -I explained patient the etiology of pes planovalgus and various treatment options were extensively discussed.  Given that patient is having underlying posterior tibial tendinitis from severe pes planovalgus I believe patient will benefit from bracing to offload the pressure on the tendon.  I believe patient may benefit from [place] versus [place] brace for posterior tibial tendinitis with dysfunction.  -Continue wearing Miso brace as tolerated.  Boneta Lucks, DPM    No follow-ups on file.   No follow-ups on file.

## 2021-05-11 ENCOUNTER — Ambulatory Visit (INDEPENDENT_AMBULATORY_CARE_PROVIDER_SITE_OTHER): Payer: Medicare Other | Admitting: Podiatry

## 2021-05-11 ENCOUNTER — Encounter: Payer: Self-pay | Admitting: Podiatry

## 2021-05-11 ENCOUNTER — Other Ambulatory Visit: Payer: Self-pay

## 2021-05-11 DIAGNOSIS — M79674 Pain in right toe(s): Secondary | ICD-10-CM | POA: Diagnosis not present

## 2021-05-11 DIAGNOSIS — B351 Tinea unguium: Secondary | ICD-10-CM | POA: Diagnosis not present

## 2021-05-11 DIAGNOSIS — M79675 Pain in left toe(s): Secondary | ICD-10-CM

## 2021-05-11 NOTE — Progress Notes (Signed)
Subjective:  Patient ID: Joyce Fields, female    DOB: Feb 10, 1946,  MRN: [id number]  Chief Complaint  Patient presents with   Nail Problem    Nail trim     75 y.o. female presents with the above complaint.  Patient presents with follow-up of posterior tibial tendinitis.  She states that the foot is doing little bit better.  She has been wearing the brace and has been managing it.  She does not want any surgeries.  She is here for thickened elongated dystrophic toenails x10 she would like to have them debrided down.  She denies any other acute complaints.   Review of Systems: Negative except as noted in the HPI. Denies N/V/F/Ch.  Past Medical History:  Diagnosis Date   Allergic rhinitis, cause unspecified    Allergy    Anemia    Anemia, unspecified    Apnea    CPAP   Arthritis    Asthma    Carpal tunnel syndrome    Dizziness and giddiness    Dysmetabolic syndrome X    GERD (gastroesophageal reflux disease)    Gout    Headache(784.0)    Heart murmur    Hemorrhoid 2012   Hyperlipidemia    Hypertension    Ingrowing nail    Migraine    Morbid obesity ([facility])    Obstructive sleep apnea (adult) (pediatric)    Osteoarthrosis, unspecified whether generalized or localized, unspecified site    Other abnormal blood chemistry    Other abnormal glucose    Other seborrheic keratosis    Pain in joint, site unspecified    Sarcoidosis    Unspecified hearing loss    Unspecified hypertrophic and atrophic condition of skin    Unspecified tinnitus    Unspecified urinary incontinence    Vertigo    Vertigo     Current Outpatient Medications:    acetaminophen (TYLENOL) 325 MG tablet, Take 650 mg by mouth as needed., Disp: , Rfl:    albuterol (PROVENTIL HFA;VENTOLIN HFA) 108 (90 BASE) MCG/ACT inhaler, Inhale into the lungs every 6 (six) hours as needed for wheezing or shortness of breath., Disp: , Rfl:    allopurinol (ZYLOPRIM) 100 MG tablet, Take 100 mg by mouth daily., Disp: , Rfl:     ALPRAZolam (XANAX) 0.5 MG tablet, Take 0.5 mg by mouth at bedtime as needed for anxiety., Disp: , Rfl:    amLODipine-olmesartan (AZOR) 5-20 MG per tablet, Take 1 tablet by mouth daily., Disp: , Rfl:    aspirin 81 MG tablet, Take 81 mg by mouth daily., Disp: , Rfl:    calcitRIOL (ROCALTROL) 0.25 MCG capsule, , Disp: , Rfl:    cetirizine (ZYRTEC) 10 MG tablet, Take 1 tablet (10 mg total) by mouth daily., Disp: 30 tablet, Rfl: 5   clonazePAM (KLONOPIN) 0.5 MG tablet, Take 0.5 mg by mouth daily as needed., Disp: , Rfl:    cyclobenzaprine (FLEXERIL) 5 MG tablet, Take 1-2 tablets (5-10 mg total) by mouth 3 (three) times daily as needed for muscle spasms., Disp: 20 tablet, Rfl: 0   desonide (DESOWEN) 0.05 % lotion, Apply topically 2 (two) times daily., Disp: , Rfl:    diazepam (VALIUM) 2 MG tablet, TAKE ONE TABLET EVERY SIX (6) HOURS AS NEEDED FOR ANXIETY, Disp: , Rfl:    DULoxetine (CYMBALTA) 20 MG capsule, Take 20 mg by mouth., Disp: , Rfl:    Ergocalciferol (VITAMIN D2) 2000 UNITS TABS, Take by mouth daily., Disp: , Rfl:    FLUoxetine (PROZAC)  40 MG capsule, Take 40 mg by mouth daily., Disp: , Rfl:    fluticasone (FLONASE) 50 MCG/ACT nasal spray, Place 2 sprays into both nostrils as needed. , Disp: , Rfl:    furosemide (LASIX) 40 MG tablet, Take 1 tablet (40 mg total) by mouth 2 (two) times daily., Disp: 180 tablet, Rfl: 3   hydrOXYzine (ATARAX/VISTARIL) 10 MG tablet, Take 10 mg by mouth as needed., Disp: , Rfl:    omeprazole (PRILOSEC) 40 MG capsule, Take 40 mg by mouth daily., Disp: , Rfl:    ondansetron (ZOFRAN) 8 MG tablet, Take 8 mg by mouth., Disp: , Rfl:    polyethylene glycol powder (GLYCOLAX/MIRALAX) 17 GM/SCOOP powder, Take by mouth., Disp: , Rfl:    potassium chloride SA (K-DUR,KLOR-CON) 20 MEQ tablet, Take 1 tablet (20 mEq total) by mouth daily., Disp: 90 tablet, Rfl: 3   prazosin (MINIPRESS) 1 MG capsule, Take 1 mg by mouth at bedtime., Disp: , Rfl:    rosuvastatin (CRESTOR) 20 MG  tablet, Take 20 mg by mouth daily., Disp: , Rfl:    Semaglutide,0.25 or 0.'5MG'$ /DOS, 2 MG/1.5ML SOPN, Inject into the skin., Disp: , Rfl:    sodium chloride (OCEAN) 0.65 % nasal spray, 1 spray by Each Nare route daily., Disp: , Rfl:    solifenacin (VESICARE) 10 MG tablet, Take 10 mg by mouth daily., Disp: , Rfl:    spironolactone (ALDACTONE) 25 MG tablet, Take 25 mg by mouth daily., Disp: , Rfl:    topiramate (TOPAMAX) 25 MG tablet, , Disp: , Rfl:    venlafaxine XR (EFFEXOR-XR) 150 MG 24 hr capsule, Take 150 mg by mouth daily., Disp: , Rfl:    verapamil (CALAN-SR) 120 MG CR tablet, Take 120 mg by mouth daily., Disp: , Rfl:    WIXELA INHUB 250-50 MCG/DOSE AEPB, , Disp: , Rfl:   Social History   Tobacco Use  Smoking Status Former   Packs/day: 1.00   Years: 25.00   Pack years: 25.00   Types: Cigarettes  Smokeless Tobacco Never    Allergies  Allergen Reactions   Bee Venom Shortness Of Breath    swelling   Metformin Other (See Comments)    Kidney issues   Gabapentin Other (See Comments)    Causes constipation   Ibuprofen     Kidney injury   Niaspan [Niacin Er] Other (See Comments)    Hot flashes   Welchol [Colesevelam Hcl] Nausea And Vomiting    indigestion   Colesevelam Nausea And Vomiting    Indigestion, burning all over   Objective:  There were no vitals filed for this visit. There is no height or weight on file to calculate BMI. Constitutional Well developed. Well nourished.  Vascular Dorsalis pedis pulses palpable bilaterally. Posterior tibial pulses palpable bilaterally. Capillary refill normal to all digits.  No cyanosis or clubbing noted. Pedal hair growth normal.  Neurologic Normal speech. Oriented to person, place, and time. Epicritic sensation to light touch grossly present bilaterally.  Dermatologic Nail Exam: Pt has thick disfigured discolored nails with subungual debris noted bilateral entire nail hallux through fifth toenails.  Pain on palpation to the  nails. Except left hallux. No nail noted to the left hallux No open wounds. No skin lesions.  Orthopedic:  Mild pain on palpation along the course of the posterior tibial tendon and as well as his insertion.  Mild pain with dorsiflexion and eversion of the foot.  Mild pain with inversion plantarflexion of the foot active and passive.  No pain  at the peroneal tendon, ATFL ligament, Achilles tendon.   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 Plantar posterior heel spurring noted.  History of previous fifth metatarsal base fracture noted.  No other osseous abnormalities noted. Assessment:   1. Pain due to onychomycosis of toenails of both feet     Plan:  Patient was evaluated and treated and all questions answered.  Onychomycosis with pain  -Nails palliatively debrided as below. -Educated on self-care  Procedure: Nail Debridement Rationale: pain  Type of Debridement: manual, sharp debridement. Instrumentation: Nail nipper, rotary burr. Number of Nails: 9  Procedures and Treatment: Consent by patient was obtained for treatment procedures. The patient understood the discussion of treatment and procedures well. All questions were answered thoroughly reviewed. Debridement of mycotic and hypertrophic toenails, 1 through 5 bilateral and clearing of subungual debris. No ulceration, no infection noted.  Return Visit-Office Procedure: Patient instructed to return to the office for a follow up visit 3 months for continued evaluation and treatment.  Left posterior tibial tendinitis -Clinically doing better.  She would like to hold off on the surgery even though the MRI is positive for bad posterior tibial tendinitis.  Pes planovalgus with underlying posterior tibial tendinitis -I explained patient the etiology of pes planovalgus and various treatment options were extensively discussed.  Given that patient is having underlying posterior  tibial tendinitis from severe pes planovalgus I believe patient will benefit from bracing to offload the pressure on the tendon.  I believe patient may benefit from [place] versus [place] brace for posterior tibial tendinitis with dysfunction.  -Continue wearing Miso brace as tolerated.  Boneta Lucks, DPM    No follow-ups on file.   No follow-ups on file.

## 2021-08-10 ENCOUNTER — Other Ambulatory Visit: Payer: Self-pay

## 2021-08-10 ENCOUNTER — Encounter (INDEPENDENT_AMBULATORY_CARE_PROVIDER_SITE_OTHER): Payer: Self-pay

## 2021-08-10 ENCOUNTER — Encounter: Payer: Self-pay | Admitting: Podiatry

## 2021-08-10 ENCOUNTER — Ambulatory Visit (INDEPENDENT_AMBULATORY_CARE_PROVIDER_SITE_OTHER): Payer: Medicare Other | Admitting: Podiatry

## 2021-08-10 DIAGNOSIS — B351 Tinea unguium: Secondary | ICD-10-CM

## 2021-08-10 DIAGNOSIS — M79675 Pain in left toe(s): Secondary | ICD-10-CM

## 2021-08-10 DIAGNOSIS — M79674 Pain in right toe(s): Secondary | ICD-10-CM

## 2021-08-10 NOTE — Progress Notes (Signed)
Subjective:  Patient ID: Joyce Fields, female    DOB: 04-11-1946,  MRN: [id number]  Chief Complaint  Patient presents with   Nail Problem    "Just cut my toenails."    75 y.o. female presents with the above complaint.  Patient presents with thickened elongated dystrophic toenails x10.  Mild pain on palpation.  Patient would like to have them debrided down.  She denies any other acute complaints   Review of Systems: Negative except as noted in the HPI. Denies N/V/F/Ch.  Past Medical History:  Diagnosis Date   Allergic rhinitis, cause unspecified    Allergy    Anemia    Anemia, unspecified    Apnea    CPAP   Arthritis    Asthma    Carpal tunnel syndrome    Dizziness and giddiness    Dysmetabolic syndrome X    GERD (gastroesophageal reflux disease)    Gout    Headache(784.0)    Heart murmur    Hemorrhoid 2012   Hyperlipidemia    Hypertension    Ingrowing nail    Migraine    Morbid obesity (HCC)    Obstructive sleep apnea (adult) (pediatric)    Osteoarthrosis, unspecified whether generalized or localized, unspecified site    Other abnormal blood chemistry    Other abnormal glucose    Other seborrheic keratosis    Pain in joint, site unspecified    Sarcoidosis    Unspecified hearing loss    Unspecified hypertrophic and atrophic condition of skin    Unspecified tinnitus    Unspecified urinary incontinence    Vertigo    Vertigo     Current Outpatient Medications:    acetaminophen (TYLENOL) 325 MG tablet, Take 650 mg by mouth as needed., Disp: , Rfl:    albuterol (PROVENTIL HFA;VENTOLIN HFA) 108 (90 BASE) MCG/ACT inhaler, Inhale into the lungs every 6 (six) hours as needed for wheezing or shortness of breath., Disp: , Rfl:    allopurinol (ZYLOPRIM) 100 MG tablet, Take 100 mg by mouth daily., Disp: , Rfl:    ALPRAZolam (XANAX) 0.5 MG tablet, Take 0.5 mg by mouth at bedtime as needed for anxiety., Disp: , Rfl:    amLODipine-olmesartan (AZOR) 5-20 MG per tablet, Take  1 tablet by mouth daily., Disp: , Rfl:    aspirin 81 MG tablet, Take 81 mg by mouth daily., Disp: , Rfl:    calcitRIOL (ROCALTROL) 0.25 MCG capsule, , Disp: , Rfl:    cetirizine (ZYRTEC) 10 MG tablet, Take 1 tablet (10 mg total) by mouth daily., Disp: 30 tablet, Rfl: 5   clonazePAM (KLONOPIN) 0.5 MG tablet, Take 0.5 mg by mouth daily as needed., Disp: , Rfl:    cyclobenzaprine (FLEXERIL) 5 MG tablet, Take 1-2 tablets (5-10 mg total) by mouth 3 (three) times daily as needed for muscle spasms., Disp: 20 tablet, Rfl: 0   desonide (DESOWEN) 0.05 % lotion, Apply topically 2 (two) times daily., Disp: , Rfl:    diazepam (VALIUM) 2 MG tablet, TAKE ONE TABLET EVERY SIX (6) HOURS AS NEEDED FOR ANXIETY, Disp: , Rfl:    DULoxetine (CYMBALTA) 20 MG capsule, Take 20 mg by mouth., Disp: , Rfl:    Ergocalciferol (VITAMIN D2) 2000 UNITS TABS, Take by mouth daily., Disp: , Rfl:    FLUoxetine (PROZAC) 40 MG capsule, Take 40 mg by mouth daily., Disp: , Rfl:    fluticasone (FLONASE) 50 MCG/ACT nasal spray, Place 2 sprays into both nostrils as needed. , Disp: , Rfl:  furosemide (LASIX) 40 MG tablet, Take 1 tablet (40 mg total) by mouth 2 (two) times daily., Disp: 180 tablet, Rfl: 3   hydrOXYzine (ATARAX/VISTARIL) 10 MG tablet, Take 10 mg by mouth as needed., Disp: , Rfl:    omeprazole (PRILOSEC) 40 MG capsule, Take 40 mg by mouth daily., Disp: , Rfl:    ondansetron (ZOFRAN) 8 MG tablet, Take 8 mg by mouth., Disp: , Rfl:    polyethylene glycol powder (GLYCOLAX/MIRALAX) 17 GM/SCOOP powder, Take by mouth., Disp: , Rfl:    potassium chloride SA (K-DUR,KLOR-CON) 20 MEQ tablet, Take 1 tablet (20 mEq total) by mouth daily., Disp: 90 tablet, Rfl: 3   prazosin (MINIPRESS) 1 MG capsule, Take 1 mg by mouth at bedtime., Disp: , Rfl:    rosuvastatin (CRESTOR) 20 MG tablet, Take 20 mg by mouth daily., Disp: , Rfl:    Semaglutide,0.25 or 0.5MG /DOS, 2 MG/1.5ML SOPN, Inject into the skin., Disp: , Rfl:    sodium chloride (OCEAN)  0.65 % nasal spray, 1 spray by Each Nare route daily., Disp: , Rfl:    solifenacin (VESICARE) 10 MG tablet, Take 10 mg by mouth daily., Disp: , Rfl:    spironolactone (ALDACTONE) 25 MG tablet, Take 25 mg by mouth daily., Disp: , Rfl:    topiramate (TOPAMAX) 25 MG tablet, , Disp: , Rfl:    venlafaxine XR (EFFEXOR-XR) 150 MG 24 hr capsule, Take 150 mg by mouth daily., Disp: , Rfl:    verapamil (CALAN-SR) 120 MG CR tablet, Take 120 mg by mouth daily., Disp: , Rfl:    WIXELA INHUB 250-50 MCG/DOSE AEPB, , Disp: , Rfl:   Social History   Tobacco Use  Smoking Status Former   Packs/day: 1.00   Years: 25.00   Pack years: 25.00   Types: Cigarettes  Smokeless Tobacco Never    Allergies  Allergen Reactions   Bee Venom Shortness Of Breath    swelling   Metformin Other (See Comments)    Kidney issues   Gabapentin Other (See Comments)    Causes constipation   Ibuprofen     Kidney injury   Niaspan [Niacin Er] Other (See Comments)    Hot flashes   Welchol [Colesevelam Hcl] Nausea And Vomiting    indigestion   Colesevelam Nausea And Vomiting    Indigestion, burning all over   Objective:  There were no vitals filed for this visit. There is no height or weight on file to calculate BMI. Constitutional Well developed. Well nourished.  Vascular Dorsalis pedis pulses palpable bilaterally. Posterior tibial pulses palpable bilaterally. Capillary refill normal to all digits.  No cyanosis or clubbing noted. Pedal hair growth normal.  Neurologic Normal speech. Oriented to person, place, and time. Epicritic sensation to light touch grossly present bilaterally.  Dermatologic Nail Exam: Pt has thick disfigured discolored nails with subungual debris noted bilateral entire nail hallux through fifth toenails.  Pain on palpation to the nails. Except left hallux. No nail noted to the left hallux No open wounds. No skin lesions.  Orthopedic:  Mild pain on palpation along the course of the posterior  tibial tendon and as well as his insertion.  Mild pain with dorsiflexion and eversion of the foot.  Mild pain with inversion plantarflexion of the foot active and passive.  No pain at the peroneal tendon, ATFL ligament, Achilles tendon.   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 Plantar posterior heel spurring noted.  History of previous fifth  metatarsal base fracture noted.  No other osseous abnormalities noted. Assessment:   No diagnosis found.   Plan:  Patient was evaluated and treated and all questions answered.  Onychomycosis with pain  -Nails palliatively debrided as below. -Educated on self-care  Procedure: Nail Debridement Rationale: pain  Type of Debridement: manual, sharp debridement. Instrumentation: Nail nipper, rotary burr. Number of Nails: 9  Procedures and Treatment: Consent by patient was obtained for treatment procedures. The patient understood the discussion of treatment and procedures well. All questions were answered thoroughly reviewed. Debridement of mycotic and hypertrophic toenails, 1 through 5 bilateral and clearing of subungual debris. No ulceration, no infection noted.  Return Visit-Office Procedure: Patient instructed to return to the office for a follow up visit 3 months for continued evaluation and treatment.  Left posterior tibial tendinitis -Clinically doing better.  She would like to hold off on the surgery even though the MRI is positive for bad posterior tibial tendinitis.  Pes planovalgus with underlying posterior tibial tendinitis -I explained patient the etiology of pes planovalgus and various treatment options were extensively discussed.  Given that patient is having underlying posterior tibial tendinitis from severe pes planovalgus I believe patient will benefit from bracing to offload the pressure on the tendon.  I believe patient may benefit from Miso versus [place] brace for  posterior tibial tendinitis with dysfunction.  -Continue wearing Miso brace as tolerated.  Boneta Lucks, DPM    No follow-ups on file.   No follow-ups on file.

## 2021-11-16 ENCOUNTER — Ambulatory Visit (INDEPENDENT_AMBULATORY_CARE_PROVIDER_SITE_OTHER): Payer: Medicare Other | Admitting: Podiatry

## 2021-11-16 ENCOUNTER — Other Ambulatory Visit: Payer: Self-pay

## 2021-11-16 DIAGNOSIS — B351 Tinea unguium: Secondary | ICD-10-CM | POA: Diagnosis not present

## 2021-11-16 DIAGNOSIS — M79674 Pain in right toe(s): Secondary | ICD-10-CM

## 2021-11-16 DIAGNOSIS — M79675 Pain in left toe(s): Secondary | ICD-10-CM

## 2021-11-16 NOTE — Progress Notes (Signed)
Subjective:  Patient ID: Joyce Fields, female    DOB: 1945/12/14,  MRN: [id number]  Chief Complaint  Patient presents with   Nail Problem    Nail trim     76 y.o. female presents with the above complaint.  Patient presents with thickened elongated dystrophic toenails x10.  Mild pain on palpation.  Patient would like to have them debrided down.  She denies any other acute complaints   Review of Systems: Negative except as noted in the HPI. Denies N/V/F/Ch.  Past Medical History:  Diagnosis Date   Allergic rhinitis, cause unspecified    Allergy    Anemia    Anemia, unspecified    Apnea    CPAP   Arthritis    Asthma    Carpal tunnel syndrome    Dizziness and giddiness    Dysmetabolic syndrome X    GERD (gastroesophageal reflux disease)    Gout    Headache(784.0)    Heart murmur    Hemorrhoid 2012   Hyperlipidemia    Hypertension    Ingrowing nail    Migraine    Morbid obesity (HCC)    Obstructive sleep apnea (adult) (pediatric)    Osteoarthrosis, unspecified whether generalized or localized, unspecified site    Other abnormal blood chemistry    Other abnormal glucose    Other seborrheic keratosis    Pain in joint, site unspecified    Sarcoidosis    Unspecified hearing loss    Unspecified hypertrophic and atrophic condition of skin    Unspecified tinnitus    Unspecified urinary incontinence    Vertigo    Vertigo     Current Outpatient Medications:    acetaminophen (TYLENOL) 325 MG tablet, Take 650 mg by mouth as needed., Disp: , Rfl:    albuterol (PROVENTIL HFA;VENTOLIN HFA) 108 (90 BASE) MCG/ACT inhaler, Inhale into the lungs every 6 (six) hours as needed for wheezing or shortness of breath., Disp: , Rfl:    allopurinol (ZYLOPRIM) 100 MG tablet, Take 100 mg by mouth daily., Disp: , Rfl:    ALPRAZolam (XANAX) 0.5 MG tablet, Take 0.5 mg by mouth at bedtime as needed for anxiety., Disp: , Rfl:    amLODipine-olmesartan (AZOR) 5-20 MG per tablet, Take 1 tablet by  mouth daily., Disp: , Rfl:    aspirin 81 MG tablet, Take 81 mg by mouth daily., Disp: , Rfl:    calcitRIOL (ROCALTROL) 0.25 MCG capsule, , Disp: , Rfl:    cetirizine (ZYRTEC) 10 MG tablet, Take 1 tablet (10 mg total) by mouth daily., Disp: 30 tablet, Rfl: 5   clonazePAM (KLONOPIN) 0.5 MG tablet, Take 0.5 mg by mouth daily as needed., Disp: , Rfl:    cyclobenzaprine (FLEXERIL) 5 MG tablet, Take 1-2 tablets (5-10 mg total) by mouth 3 (three) times daily as needed for muscle spasms., Disp: 20 tablet, Rfl: 0   desonide (DESOWEN) 0.05 % lotion, Apply topically 2 (two) times daily., Disp: , Rfl:    diazepam (VALIUM) 2 MG tablet, TAKE ONE TABLET EVERY SIX (6) HOURS AS NEEDED FOR ANXIETY, Disp: , Rfl:    DULoxetine (CYMBALTA) 20 MG capsule, Take 20 mg by mouth., Disp: , Rfl:    Ergocalciferol (VITAMIN D2) 2000 UNITS TABS, Take by mouth daily., Disp: , Rfl:    FLUoxetine (PROZAC) 40 MG capsule, Take 40 mg by mouth daily., Disp: , Rfl:    fluticasone (FLONASE) 50 MCG/ACT nasal spray, Place 2 sprays into both nostrils as needed. , Disp: , Rfl:  furosemide (LASIX) 40 MG tablet, Take 1 tablet (40 mg total) by mouth 2 (two) times daily., Disp: 180 tablet, Rfl: 3   hydrOXYzine (ATARAX/VISTARIL) 10 MG tablet, Take 10 mg by mouth as needed., Disp: , Rfl:    omeprazole (PRILOSEC) 40 MG capsule, Take 40 mg by mouth daily., Disp: , Rfl:    ondansetron (ZOFRAN) 8 MG tablet, Take 8 mg by mouth., Disp: , Rfl:    polyethylene glycol powder (GLYCOLAX/MIRALAX) 17 GM/SCOOP powder, Take by mouth., Disp: , Rfl:    potassium chloride SA (K-DUR,KLOR-CON) 20 MEQ tablet, Take 1 tablet (20 mEq total) by mouth daily., Disp: 90 tablet, Rfl: 3   prazosin (MINIPRESS) 1 MG capsule, Take 1 mg by mouth at bedtime., Disp: , Rfl:    rosuvastatin (CRESTOR) 20 MG tablet, Take 20 mg by mouth daily., Disp: , Rfl:    Semaglutide,0.25 or 0.5MG /DOS, 2 MG/1.5ML SOPN, Inject into the skin., Disp: , Rfl:    sodium chloride (OCEAN) 0.65 % nasal  spray, 1 spray by Each Nare route daily., Disp: , Rfl:    solifenacin (VESICARE) 10 MG tablet, Take 10 mg by mouth daily., Disp: , Rfl:    spironolactone (ALDACTONE) 25 MG tablet, Take 25 mg by mouth daily., Disp: , Rfl:    topiramate (TOPAMAX) 25 MG tablet, , Disp: , Rfl:    venlafaxine XR (EFFEXOR-XR) 150 MG 24 hr capsule, Take 150 mg by mouth daily., Disp: , Rfl:    verapamil (CALAN-SR) 120 MG CR tablet, Take 120 mg by mouth daily., Disp: , Rfl:    WIXELA INHUB 250-50 MCG/DOSE AEPB, , Disp: , Rfl:   Social History   Tobacco Use  Smoking Status Former   Packs/day: 1.00   Years: 25.00   Pack years: 25.00   Types: Cigarettes  Smokeless Tobacco Never    Allergies  Allergen Reactions   Bee Venom Shortness Of Breath    swelling   Metformin Other (See Comments)    Kidney issues   Gabapentin Other (See Comments)    Causes constipation   Ibuprofen     Kidney injury   Niaspan [Niacin Er] Other (See Comments)    Hot flashes   Welchol [Colesevelam Hcl] Nausea And Vomiting    indigestion   Colesevelam Nausea And Vomiting    Indigestion, burning all over   Objective:  There were no vitals filed for this visit. There is no height or weight on file to calculate BMI. Constitutional Well developed. Well nourished.  Vascular Dorsalis pedis pulses palpable bilaterally. Posterior tibial pulses palpable bilaterally. Capillary refill normal to all digits.  No cyanosis or clubbing noted. Pedal hair growth normal.  Neurologic Normal speech. Oriented to person, place, and time. Epicritic sensation to light touch grossly present bilaterally.  Dermatologic Nail Exam: Pt has thick disfigured discolored nails with subungual debris noted bilateral entire nail hallux through fifth toenails.  Pain on palpation to the nails. Except left hallux. No nail noted to the left hallux No open wounds. No skin lesions.  Orthopedic:  Mild pain on palpation along the course of the posterior tibial tendon  and as well as his insertion.  Mild pain with dorsiflexion and eversion of the foot.  Mild pain with inversion plantarflexion of the foot active and passive.  No pain at the peroneal tendon, ATFL ligament, Achilles tendon.   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 Plantar posterior heel spurring noted.  History of previous fifth  metatarsal base fracture noted.  No other osseous abnormalities noted. Assessment:   1. Pain due to onychomycosis of toenails of both feet      Plan:  Patient was evaluated and treated and all questions answered.  Onychomycosis with pain  -Nails palliatively debrided as below. -Educated on self-care  Procedure: Nail Debridement Rationale: pain  Type of Debridement: manual, sharp debridement. Instrumentation: Nail nipper, rotary burr. Number of Nails: 9  Procedures and Treatment: Consent by patient was obtained for treatment procedures. The patient understood the discussion of treatment and procedures well. All questions were answered thoroughly reviewed. Debridement of mycotic and hypertrophic toenails, 1 through 5 bilateral and clearing of subungual debris. No ulceration, no infection noted.  Return Visit-Office Procedure: Patient instructed to return to the office for a follow up visit 3 months for continued evaluation and treatment.  Left posterior tibial tendinitis -Clinically doing better.  She would like to hold off on the surgery even though the MRI is positive for bad posterior tibial tendinitis.  Pes planovalgus with underlying posterior tibial tendinitis -I explained patient the etiology of pes planovalgus and various treatment options were extensively discussed.  Given that patient is having underlying posterior tibial tendinitis from severe pes planovalgus I believe patient will benefit from bracing to offload the pressure on the tendon.  I believe patient may benefit from Miso versus  [place] brace for posterior tibial tendinitis with dysfunction.  -Continue wearing Miso brace as tolerated.  Boneta Lucks, DPM    No follow-ups on file.   No follow-ups on file.

## 2022-02-15 ENCOUNTER — Encounter: Payer: Self-pay | Admitting: Podiatry

## 2022-02-15 ENCOUNTER — Ambulatory Visit (INDEPENDENT_AMBULATORY_CARE_PROVIDER_SITE_OTHER): Payer: Medicare Other | Admitting: Podiatry

## 2022-02-15 DIAGNOSIS — M79675 Pain in left toe(s): Secondary | ICD-10-CM | POA: Diagnosis not present

## 2022-02-15 DIAGNOSIS — B351 Tinea unguium: Secondary | ICD-10-CM | POA: Diagnosis not present

## 2022-02-15 DIAGNOSIS — M79674 Pain in right toe(s): Secondary | ICD-10-CM

## 2022-02-15 NOTE — Progress Notes (Signed)
?Subjective:  ?Patient ID: Joyce Fields, female    DOB: 11/26/1945,  MRN: [id number] ? ?Chief Complaint  ?Patient presents with  ? Nail Problem  ?  Nail trim   ? ? ?76 y.o. female presents with the above complaint.  Patient presents with thickened elongated dystrophic toenails x10.  Mild pain on palpation.  Patient would like to have them debrided down.  She denies any other acute complaints ? ? ?Review of Systems: Negative except as noted in the HPI. Denies N/V/F/Ch. ? ?Past Medical History:  ?Diagnosis Date  ? Allergic rhinitis, cause unspecified   ? Allergy   ? Anemia   ? Anemia, unspecified   ? Apnea   ? CPAP  ? Arthritis   ? Asthma   ? Carpal tunnel syndrome   ? Dizziness and giddiness   ? Dysmetabolic syndrome X   ? GERD (gastroesophageal reflux disease)   ? Gout   ? Headache(784.0)   ? Heart murmur   ? Hemorrhoid 2012  ? Hyperlipidemia   ? Hypertension   ? Ingrowing nail   ? Migraine   ? Morbid obesity ([place])   ? Obstructive sleep apnea (adult) (pediatric)   ? Osteoarthrosis, unspecified whether generalized or localized, unspecified site   ? Other abnormal blood chemistry   ? Other abnormal glucose   ? Other seborrheic keratosis   ? Pain in joint, site unspecified   ? Sarcoidosis   ? Unspecified hearing loss   ? Unspecified hypertrophic and atrophic condition of skin   ? Unspecified tinnitus   ? Unspecified urinary incontinence   ? Vertigo   ? Vertigo   ? ? ?Current Outpatient Medications:  ?  acetaminophen (TYLENOL) 325 MG tablet, Take 650 mg by mouth as needed., Disp: , Rfl:  ?  albuterol (PROVENTIL HFA;VENTOLIN HFA) 108 (90 BASE) MCG/ACT inhaler, Inhale into the lungs every 6 (six) hours as needed for wheezing or shortness of breath., Disp: , Rfl:  ?  allopurinol (ZYLOPRIM) 100 MG tablet, Take 100 mg by mouth daily., Disp: , Rfl:  ?  ALPRAZolam (XANAX) 0.5 MG tablet, Take 0.5 mg by mouth at bedtime as needed for anxiety., Disp: , Rfl:  ?  amLODipine-olmesartan (AZOR) 5-20 MG per tablet, Take 1 tablet by  mouth daily., Disp: , Rfl:  ?  aspirin 81 MG tablet, Take 81 mg by mouth daily., Disp: , Rfl:  ?  calcitRIOL (ROCALTROL) 0.25 MCG capsule, , Disp: , Rfl:  ?  cetirizine (ZYRTEC) 10 MG tablet, Take 1 tablet (10 mg total) by mouth daily., Disp: 30 tablet, Rfl: 5 ?  clonazePAM (KLONOPIN) 0.5 MG tablet, Take 0.5 mg by mouth daily as needed., Disp: , Rfl:  ?  cyclobenzaprine (FLEXERIL) 5 MG tablet, Take 1-2 tablets (5-10 mg total) by mouth 3 (three) times daily as needed for muscle spasms., Disp: 20 tablet, Rfl: 0 ?  desonide (DESOWEN) 0.05 % lotion, Apply topically 2 (two) times daily., Disp: , Rfl:  ?  diazepam (VALIUM) 2 MG tablet, TAKE ONE TABLET EVERY SIX (6) HOURS AS NEEDED FOR ANXIETY, Disp: , Rfl:  ?  DULoxetine (CYMBALTA) 20 MG capsule, Take 20 mg by mouth., Disp: , Rfl:  ?  Ergocalciferol (VITAMIN D2) 2000 UNITS TABS, Take by mouth daily., Disp: , Rfl:  ?  FLUoxetine (PROZAC) 40 MG capsule, Take 40 mg by mouth daily., Disp: , Rfl:  ?  fluticasone (FLONASE) 50 MCG/ACT nasal spray, Place 2 sprays into both nostrils as needed. , Disp: , Rfl:  ?  furosemide (LASIX) 40 MG tablet, Take 1 tablet (40 mg total) by mouth 2 (two) times daily., Disp: 180 tablet, Rfl: 3 ?  hydrOXYzine (ATARAX/VISTARIL) 10 MG tablet, Take 10 mg by mouth as needed., Disp: , Rfl:  ?  omeprazole (PRILOSEC) 40 MG capsule, Take 40 mg by mouth daily., Disp: , Rfl:  ?  ondansetron (ZOFRAN) 8 MG tablet, Take 8 mg by mouth., Disp: , Rfl:  ?  polyethylene glycol powder (GLYCOLAX/MIRALAX) 17 GM/SCOOP powder, Take by mouth., Disp: , Rfl:  ?  potassium chloride SA (K-DUR,KLOR-CON) 20 MEQ tablet, Take 1 tablet (20 mEq total) by mouth daily., Disp: 90 tablet, Rfl: 3 ?  prazosin (MINIPRESS) 1 MG capsule, Take 1 mg by mouth at bedtime., Disp: , Rfl:  ?  rosuvastatin (CRESTOR) 20 MG tablet, Take 20 mg by mouth daily., Disp: , Rfl:  ?  Semaglutide,0.25 or 0.'5MG'$ /DOS, 2 MG/1.5ML SOPN, Inject into the skin., Disp: , Rfl:  ?  sodium chloride (OCEAN) 0.65 % nasal  spray, 1 spray by Each Nare route daily., Disp: , Rfl:  ?  solifenacin (VESICARE) 10 MG tablet, Take 10 mg by mouth daily., Disp: , Rfl:  ?  spironolactone (ALDACTONE) 25 MG tablet, Take 25 mg by mouth daily., Disp: , Rfl:  ?  topiramate (TOPAMAX) 25 MG tablet, , Disp: , Rfl:  ?  venlafaxine XR (EFFEXOR-XR) 150 MG 24 hr capsule, Take 150 mg by mouth daily., Disp: , Rfl:  ?  verapamil (CALAN-SR) 120 MG CR tablet, Take 120 mg by mouth daily., Disp: , Rfl:  ?  WIXELA INHUB 250-50 MCG/DOSE AEPB, , Disp: , Rfl:  ? ?Social History  ? ?Tobacco Use  ?Smoking Status Former  ? Packs/day: 1.00  ? Years: 25.00  ? Pack years: 25.00  ? Types: Cigarettes  ?Smokeless Tobacco Never  ? ? ?Allergies  ?Allergen Reactions  ? Bee Venom Shortness Of Breath  ?  swelling  ? Metformin Other (See Comments)  ?  Kidney issues  ? Gabapentin Other (See Comments)  ?  Causes constipation  ? Ibuprofen   ?  Kidney injury  ? Niaspan [Niacin Er] Other (See Comments)  ?  Hot flashes  ? Welchol [Colesevelam Hcl] Nausea And Vomiting  ?  indigestion  ? Colesevelam Nausea And Vomiting  ?  Indigestion, burning all over  ? ?Objective:  ?There were no vitals filed for this visit. ?There is no height or weight on file to calculate BMI. ?Constitutional Well developed. ?Well nourished.  ?Vascular Dorsalis pedis pulses palpable bilaterally. ?Posterior tibial pulses palpable bilaterally. ?Capillary refill normal to all digits.  ?No cyanosis or clubbing noted. ?Pedal hair growth normal.  ?Neurologic Normal speech. ?Oriented to person, place, and time. ?Epicritic sensation to light touch grossly present bilaterally.  ?Dermatologic Nail Exam: Pt has thick disfigured discolored nails with subungual debris noted bilateral entire nail hallux through fifth toenails.  Pain on palpation to the nails. Except left hallux. No nail noted to the left hallux ?No open wounds. ?No skin lesions.  ?Orthopedic:  Mild pain on palpation along the course of the posterior tibial tendon  and as well as his insertion.  Mild pain with dorsiflexion and eversion of the foot.  Mild pain with inversion plantarflexion of the foot active and passive.  No pain at the peroneal tendon, ATFL ligament, Achilles tendon.  ? ?Radiographs: 3 views of skeletally mature adult left foot: Severe arthrosis noted in the dorsal midfoot. Plantar posterior heel spurring noted.  History of previous fifth  metatarsal base fracture noted.  No other osseous abnormalities noted. ?Assessment:  ? ?1. Pain due to onychomycosis of toenails of both feet   ? ? ? ? ?Plan:  ?Patient was evaluated and treated and all questions answered. ? ?Onychomycosis with pain  ?-Nails palliatively debrided as below. ?-Educated on self-care ? ?Procedure: Nail Debridement ?Rationale: pain  ?Type of Debridement: manual, sharp debridement. ?Instrumentation: Nail nipper, rotary burr. ?Number of Nails: 9 ? ?Procedures and Treatment: Consent by patient was obtained for treatment procedures. The patient understood the discussion of treatment and procedures well. All questions were answered thoroughly reviewed. Debridement of mycotic and hypertrophic toenails, 1 through 5 bilateral and clearing of subungual debris. No ulceration, no infection noted.  ?Return Visit-Office Procedure: Patient instructed to return to the office for a follow up visit 3 months for continued evaluation and treatment. ? ?Boneta Lucks, DPM ?  ? ?No follow-ups on file. ? ? ?No follow-ups on file.  ?

## 2022-03-20 ENCOUNTER — Emergency Department
Admission: EM | Admit: 2022-03-20 | Discharge: 2022-03-20 | Disposition: A | Discharge status: Home / Self Care | Payer: Medicare Other | Attending: Student in an Organized Health Care Education/Training Program | Admitting: Student in an Organized Health Care Education/Training Program

## 2022-03-20 ENCOUNTER — Emergency Department: Payer: Medicare Other

## 2022-03-20 ENCOUNTER — Other Ambulatory Visit: Payer: Self-pay

## 2022-03-20 DIAGNOSIS — M542 Cervicalgia: Secondary | ICD-10-CM | POA: Diagnosis not present

## 2022-03-20 DIAGNOSIS — W19XXXA Unspecified fall, initial encounter: Secondary | ICD-10-CM | POA: Diagnosis not present

## 2022-03-20 DIAGNOSIS — M25552 Pain in left hip: Secondary | ICD-10-CM

## 2022-03-20 DIAGNOSIS — S0990XA Unspecified injury of head, initial encounter: Secondary | ICD-10-CM

## 2022-03-20 DIAGNOSIS — M25512 Pain in left shoulder: Secondary | ICD-10-CM | POA: Insufficient documentation

## 2022-03-20 DIAGNOSIS — M25522 Pain in left elbow: Secondary | ICD-10-CM | POA: Diagnosis not present

## 2022-03-20 DIAGNOSIS — R519 Headache, unspecified: Secondary | ICD-10-CM | POA: Diagnosis present

## 2022-03-20 DIAGNOSIS — Y9222 Religious institution as the place of occurrence of the external cause: Secondary | ICD-10-CM | POA: Insufficient documentation

## 2022-03-20 LAB — COMPREHENSIVE METABOLIC PANEL
ALT: 13 U/L (ref 0–44)
AST: 19 U/L (ref 15–41)
Albumin: 3.6 g/dL (ref 3.5–5.0)
Alkaline Phosphatase: 90 U/L (ref 38–126)
Anion gap: 4 — ABNORMAL LOW (ref 5–15)
BUN: 26 mg/dL — ABNORMAL HIGH (ref 8–23)
CO2: 25 mmol/L (ref 22–32)
Calcium: 8.7 mg/dL — ABNORMAL LOW (ref 8.9–10.3)
Chloride: 108 mmol/L (ref 98–111)
Creatinine, Ser: 1.12 mg/dL — ABNORMAL HIGH (ref 0.44–1.00)
GFR, Estimated: 52 mL/min — ABNORMAL LOW (ref >60)
Glucose, Bld: 110 mg/dL — ABNORMAL HIGH (ref 70–99)
Potassium: 4.1 mmol/L (ref 3.5–5.1)
Sodium: 137 mmol/L (ref 135–145)
Total Bilirubin: 0.4 mg/dL (ref 0.3–1.2)
Total Protein: 7.7 g/dL (ref 6.5–8.1)

## 2022-03-20 LAB — CBC WITH DIFFERENTIAL/PLATELET
Abs Immature Granulocytes: 0.07 10*3/uL (ref 0.00–0.07)
Basophils Absolute: 0 10*3/uL (ref 0.0–0.1)
Basophils Relative: 0 %
Eosinophils Absolute: 0.2 10*3/uL (ref 0.0–0.5)
Eosinophils Relative: 2 %
HCT: 39.1 % (ref 36.0–46.0)
Hemoglobin: 12.2 g/dL (ref 12.0–15.0)
Immature Granulocytes: 1 %
Lymphocytes Relative: 27 %
Lymphs Abs: 2.9 10*3/uL (ref 0.7–4.0)
MCH: 29 pg (ref 26.0–34.0)
MCHC: 31.2 g/dL (ref 30.0–36.0)
MCV: 93.1 fL (ref 80.0–100.0)
Monocytes Absolute: 0.7 10*3/uL (ref 0.1–1.0)
Monocytes Relative: 7 %
Neutro Abs: 6.8 10*3/uL (ref 1.7–7.7)
Neutrophils Relative %: 63 %
Platelets: 269 10*3/uL (ref 150–400)
RBC: 4.2 MIL/uL (ref 3.87–5.11)
RDW: 13.6 % (ref 11.5–15.5)
WBC: 10.8 10*3/uL — ABNORMAL HIGH (ref 4.0–10.5)
nRBC: 0 % (ref 0.0–0.2)

## 2022-03-20 LAB — TROPONIN I (HIGH SENSITIVITY)
Troponin I (High Sensitivity): 5 ng/L (ref <18)
Troponin I (High Sensitivity): 6 ng/L (ref <18)

## 2022-03-20 MED ORDER — ACETAMINOPHEN 500 MG PO TABS
1000.0000 mg | ORAL_TABLET | Freq: Once | ORAL | Status: AC
  Administered 2022-03-20: 1000 mg via ORAL
  Filled 2022-03-20: qty 2

## 2022-03-20 NOTE — ED Provider Notes (Signed)
[facility] Provider Note    Event Date/Time   First MD Initiated Contact with Patient 03/20/22 1017     (approximate)   History   No chief complaint on file.   HPI  Joyce Fields is a 76 y.o. female presents to the ER for evaluation of left elbow pain left shoulder pain left hip pain headache patient had a mechanical fall while leaving to go to church today.  She is not on any blood thinners.  Denies any numbness or tingling.  States her pain is mild to moderate and like some Tylenol.  Denies any abdominal pain.  No palpitations or shortness of breath.     Physical Exam   Triage Vital Signs: ED Triage Vitals  Enc Vitals Group     BP 03/20/22 1024 (!) 120/95     Pulse Rate 03/20/22 1024 67     Resp 03/20/22 1024 19     Temp 03/20/22 1024 97.9 F (36.6 C)     Temp Source 03/20/22 1024 Oral     SpO2 03/20/22 1020 97 %     Weight 03/20/22 1025 280 lb (127 kg)     Height 03/20/22 1025 5' (1.524 m)     Head Circumference --      Peak Flow --      Pain Score 03/20/22 1024 6     Pain Loc --      Pain Edu? --      Excl. in [place]? --     Most recent vital signs: Vitals:   03/20/22 1310 03/20/22 1429  BP: (!) 117/43 (!) 122/51  Pulse: (!) 59 61  Resp: 20 18  Temp:  98 F (36.7 C)  SpO2: 97% 98%     Constitutional: Alert  Eyes: Conjunctivae are normal.  Head: Atraumatic. Nose: No congestion/rhinnorhea. Mouth/Throat: Mucous membranes are moist.   Neck: Painless ROM.  Cardiovascular:   Good peripheral circulation. Respiratory: Normal respiratory effort.  No retractions.  Gastrointestinal: Soft and nontender.  Musculoskeletal:  no deformity Neurologic:  MAE spontaneously. No gross focal neurologic deficits are appreciated.  Skin:  Skin is warm, dry and intact. No rash noted. Psychiatric: Mood and affect are normal. Speech and behavior are normal.    ED Results / Procedures / Treatments   Labs (all labs ordered are listed, but only  abnormal results are displayed) Labs Reviewed  CBC WITH DIFFERENTIAL/PLATELET - Abnormal; Notable for the following components:      Result Value   WBC 10.8 (*)    All other components within normal limits  COMPREHENSIVE METABOLIC PANEL - Abnormal; Notable for the following components:   Glucose, Bld 110 (*)    BUN 26 (*)    Creatinine, Ser 1.12 (*)    Calcium 8.7 (*)    GFR, Estimated 52 (*)    Anion gap 4 (*)    All other components within normal limits  TROPONIN I (HIGH SENSITIVITY)  TROPONIN I (HIGH SENSITIVITY)     EKG  ED ECG REPORT I, Merlyn Lot, the attending physician, personally viewed and interpreted this ECG.   Date: 03/20/2022  EKG Time: 10:23  Rate: fall  Rhythm: sinus  Axis: normal  Intervals:normal  ST&T Change: no stemi, no depressions    RADIOLOGY Please see ED Course for my review and interpretation.  I personally reviewed all radiographic images ordered to evaluate for the above acute complaints and reviewed radiology reports and findings.  These findings were personally discussed with  the patient.  Please see medical record for radiology report.    PROCEDURES:  Critical Care performed: No  Procedures   MEDICATIONS ORDERED IN ED: Medications  acetaminophen (TYLENOL) tablet 1,000 mg (1,000 mg Oral Given 03/20/22 1046)     IMPRESSION / MDM / ASSESSMENT AND PLAN / ED COURSE  I reviewed the triage vital signs and the nursing notes.                              Differential diagnosis includes, but is not limited to, fracture, contusion, laceration, dislocation, electrolyte abnormality, dysrhythmia, SDH, IPH,  Patient presented to the ER for evaluation of symptoms as described above.  Signs like it is primarily mechanical fall.  Complaining of left elbow pain left hip pain posterior head and neck pain.  Blood will be ordered for the but differential will order CT as well as radiographs.  This presenting complaint could reflect a  potentially life-threatening illness therefore the patient will be placed on continuous pulse oximetry and telemetry for monitoring.   Clinical Course as of 03/20/22 1447  Sun Mar 20, 2022  1158 X-ray hip on my interpretation does not show any evidence of fracture. [PR]  1326 Patient reassessed.  We discussed the incidental findings of the right hip.  She is able to ambulate.  Does not seem consistent with acute fracture or acute injury.  At this point believe she stable and appropriate for outpatient follow-up with orthopedics regarding that finding. [PR]  1351 Repeat troponin is negative.  CT imaging of the head and neck without acute traumatic injury.  Patient feels well at this point.  Does appear stable and appropriate for outpatient follow-up. [PR]    Clinical Course User Index [PR] Merlyn Lot, MD      FINAL CLINICAL IMPRESSION(S) / ED DIAGNOSES   Final diagnoses:  Fall, initial encounter  Minor head injury, initial encounter  Left elbow pain  Pain of left hip     Rx / DC Orders   ED Discharge Orders     None        Note:  This document was prepared using Dragon voice recognition software and may include unintentional dictation errors.    Merlyn Lot, MD 03/20/22 [id number]

## 2022-03-20 NOTE — ED Triage Notes (Signed)
Pt bib ems c/o mechanical fall while getting out of house, left arm pain/ left hip pain, hit head. Pt denies feeling dizzy. Per pt, she doesn't remember falling. H/O Vertigo. Takes ASA '81mg'$ . Pt ao x 4.  140/68 HR 69 SPO2 97 CBG 141

## 2022-05-19 ENCOUNTER — Ambulatory Visit (INDEPENDENT_AMBULATORY_CARE_PROVIDER_SITE_OTHER): Payer: Medicare Other | Admitting: Podiatry

## 2022-05-19 DIAGNOSIS — M79674 Pain in right toe(s): Secondary | ICD-10-CM

## 2022-05-19 DIAGNOSIS — M79675 Pain in left toe(s): Secondary | ICD-10-CM

## 2022-05-19 DIAGNOSIS — B351 Tinea unguium: Secondary | ICD-10-CM

## 2022-05-21 NOTE — Progress Notes (Signed)
Subjective:  Patient ID: Joyce Fields, female    DOB: 1946-02-11,  MRN: [id number]  Chief Complaint  Patient presents with   Nail Problem    76 y.o. female presents with the above complaint.  Patient presents with thickened elongated dystrophic toenails x10.  Mild pain on palpation.  Patient would like to have them debrided down.  She denies any other acute complaints   Review of Systems: Negative except as noted in the HPI. Denies N/V/F/Ch.  Past Medical History:  Diagnosis Date   Allergic rhinitis, cause unspecified    Allergy    Anemia    Anemia, unspecified    Apnea    CPAP   Arthritis    Asthma    Carpal tunnel syndrome    Dizziness and giddiness    Dysmetabolic syndrome X    GERD (gastroesophageal reflux disease)    Gout    Headache(784.0)    Heart murmur    Hemorrhoid 2012   Hyperlipidemia    Hypertension    Ingrowing nail    Migraine    Morbid obesity (HCC)    Obstructive sleep apnea (adult) (pediatric)    Osteoarthrosis, unspecified whether generalized or localized, unspecified site    Other abnormal blood chemistry    Other abnormal glucose    Other seborrheic keratosis    Pain in joint, site unspecified    Sarcoidosis    Unspecified hearing loss    Unspecified hypertrophic and atrophic condition of skin    Unspecified tinnitus    Unspecified urinary incontinence    Vertigo    Vertigo     Current Outpatient Medications:    acetaminophen (TYLENOL) 325 MG tablet, Take 650 mg by mouth as needed., Disp: , Rfl:    albuterol (PROVENTIL HFA;VENTOLIN HFA) 108 (90 BASE) MCG/ACT inhaler, Inhale into the lungs every 6 (six) hours as needed for wheezing or shortness of breath., Disp: , Rfl:    allopurinol (ZYLOPRIM) 100 MG tablet, Take 100 mg by mouth daily., Disp: , Rfl:    ALPRAZolam (XANAX) 0.5 MG tablet, Take 0.5 mg by mouth at bedtime as needed for anxiety., Disp: , Rfl:    amLODipine-olmesartan (AZOR) 5-20 MG per tablet, Take 1 tablet by mouth daily.,  Disp: , Rfl:    aspirin 81 MG tablet, Take 81 mg by mouth daily., Disp: , Rfl:    calcitRIOL (ROCALTROL) 0.25 MCG capsule, , Disp: , Rfl:    cetirizine (ZYRTEC) 10 MG tablet, Take 1 tablet (10 mg total) by mouth daily., Disp: 30 tablet, Rfl: 5   clonazePAM (KLONOPIN) 0.5 MG tablet, Take 0.5 mg by mouth daily as needed., Disp: , Rfl:    cyclobenzaprine (FLEXERIL) 5 MG tablet, Take 1-2 tablets (5-10 mg total) by mouth 3 (three) times daily as needed for muscle spasms., Disp: 20 tablet, Rfl: 0   desonide (DESOWEN) 0.05 % lotion, Apply topically 2 (two) times daily., Disp: , Rfl:    diazepam (VALIUM) 2 MG tablet, TAKE ONE TABLET EVERY SIX (6) HOURS AS NEEDED FOR ANXIETY, Disp: , Rfl:    DULoxetine (CYMBALTA) 20 MG capsule, Take 20 mg by mouth., Disp: , Rfl:    Ergocalciferol (VITAMIN D2) 2000 UNITS TABS, Take by mouth daily., Disp: , Rfl:    FLUoxetine (PROZAC) 40 MG capsule, Take 40 mg by mouth daily., Disp: , Rfl:    fluticasone (FLONASE) 50 MCG/ACT nasal spray, Place 2 sprays into both nostrils as needed. , Disp: , Rfl:    furosemide (LASIX) 40 MG  tablet, Take 1 tablet (40 mg total) by mouth 2 (two) times daily., Disp: 180 tablet, Rfl: 3   hydrOXYzine (ATARAX/VISTARIL) 10 MG tablet, Take 10 mg by mouth as needed., Disp: , Rfl:    omeprazole (PRILOSEC) 40 MG capsule, Take 40 mg by mouth daily., Disp: , Rfl:    ondansetron (ZOFRAN) 8 MG tablet, Take 8 mg by mouth., Disp: , Rfl:    polyethylene glycol powder (GLYCOLAX/MIRALAX) 17 GM/SCOOP powder, Take by mouth., Disp: , Rfl:    potassium chloride SA (K-DUR,KLOR-CON) 20 MEQ tablet, Take 1 tablet (20 mEq total) by mouth daily., Disp: 90 tablet, Rfl: 3   prazosin (MINIPRESS) 1 MG capsule, Take 1 mg by mouth at bedtime., Disp: , Rfl:    rosuvastatin (CRESTOR) 20 MG tablet, Take 20 mg by mouth daily., Disp: , Rfl:    Semaglutide,0.25 or 0.'5MG'$ /DOS, 2 MG/1.5ML SOPN, Inject into the skin., Disp: , Rfl:    sodium chloride (OCEAN) 0.65 % nasal spray, 1 spray  by Each Nare route daily., Disp: , Rfl:    solifenacin (VESICARE) 10 MG tablet, Take 10 mg by mouth daily., Disp: , Rfl:    spironolactone (ALDACTONE) 25 MG tablet, Take 25 mg by mouth daily., Disp: , Rfl:    topiramate (TOPAMAX) 25 MG tablet, , Disp: , Rfl:    venlafaxine XR (EFFEXOR-XR) 150 MG 24 hr capsule, Take 150 mg by mouth daily., Disp: , Rfl:    verapamil (CALAN-SR) 120 MG CR tablet, Take 120 mg by mouth daily., Disp: , Rfl:    WIXELA INHUB 250-50 MCG/DOSE AEPB, , Disp: , Rfl:   Social History   Tobacco Use  Smoking Status Former   Packs/day: 1.00   Years: 25.00   Total pack years: 25.00   Types: Cigarettes  Smokeless Tobacco Never    Allergies  Allergen Reactions   Bee Venom Shortness Of Breath    swelling   Metformin Other (See Comments)    Kidney issues   Gabapentin Other (See Comments)    Causes constipation   Ibuprofen     Kidney injury   Niaspan [Niacin Er] Other (See Comments)    Hot flashes   Welchol [Colesevelam Hcl] Nausea And Vomiting    indigestion   Colesevelam Nausea And Vomiting    Indigestion, burning all over   Objective:  There were no vitals filed for this visit. There is no height or weight on file to calculate BMI. Constitutional Well developed. Well nourished.  Vascular Dorsalis pedis pulses palpable bilaterally. Posterior tibial pulses palpable bilaterally. Capillary refill normal to all digits.  No cyanosis or clubbing noted. Pedal hair growth normal.  Neurologic Normal speech. Oriented to person, place, and time. Epicritic sensation to light touch grossly present bilaterally.  Dermatologic Nail Exam: Pt has thick disfigured discolored nails with subungual debris noted bilateral entire nail hallux through fifth toenails.  Pain on palpation to the nails. Except left hallux. No nail noted to the left hallux No open wounds. No skin lesions.  Orthopedic:  Mild pain on palpation along the course of the posterior tibial tendon and as well  as his insertion.  Mild pain with dorsiflexion and eversion of the foot.  Mild pain with inversion plantarflexion of the foot active and passive.  No pain at the peroneal tendon, ATFL ligament, Achilles tendon.   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 Plantar posterior heel spurring noted.  History of previous fifth metatarsal base fracture  noted.  No other osseous abnormalities noted. Assessment:   No diagnosis found.     Plan:  Patient was evaluated and treated and all questions answered.  Onychomycosis with pain  -Nails palliatively debrided as below. -Educated on self-care  Procedure: Nail Debridement Rationale: pain  Type of Debridement: manual, sharp debridement. Instrumentation: Nail nipper, rotary burr. Number of Nails: 9  Procedures and Treatment: Consent by patient was obtained for treatment procedures. The patient understood the discussion of treatment and procedures well. All questions were answered thoroughly reviewed. Debridement of mycotic and hypertrophic toenails, 1 through 5 bilateral and clearing of subungual debris. No ulceration, no infection noted.  Return Visit-Office Procedure: Patient instructed to return to the office for a follow up visit 3 months for continued evaluation and treatment.  Boneta Lucks, DPM    No follow-ups on file.   No follow-ups on file.

## 2022-08-23 ENCOUNTER — Ambulatory Visit: Payer: Medicare Other | Admitting: Podiatry

## 2022-10-28 ENCOUNTER — Ambulatory Visit (INDEPENDENT_AMBULATORY_CARE_PROVIDER_SITE_OTHER): Payer: Medicare Other | Admitting: Podiatry

## 2022-10-28 DIAGNOSIS — B351 Tinea unguium: Secondary | ICD-10-CM

## 2022-10-28 DIAGNOSIS — M79675 Pain in left toe(s): Secondary | ICD-10-CM | POA: Diagnosis not present

## 2022-10-28 DIAGNOSIS — M79674 Pain in right toe(s): Secondary | ICD-10-CM

## 2022-10-28 NOTE — Progress Notes (Signed)
Subjective:  Patient ID: Joyce Fields, female    DOB: 1946-02-21,  MRN: [id number]  Chief Complaint  Patient presents with   Nail Problem    77 y.o. female presents with the above complaint.  Patient presents with thickened elongated dystrophic toenails x10.  Mild pain on palpation.  Patient would like to have them debrided down.  She denies any other acute complaints   Review of Systems: Negative except as noted in the HPI. Denies N/V/F/Ch.  Past Medical History:  Diagnosis Date   Allergic rhinitis, cause unspecified    Allergy    Anemia    Anemia, unspecified    Apnea    CPAP   Arthritis    Asthma    Carpal tunnel syndrome    Dizziness and giddiness    Dysmetabolic syndrome X    GERD (gastroesophageal reflux disease)    Gout    Headache(784.0)    Heart murmur    Hemorrhoid 2012   Hyperlipidemia    Hypertension    Ingrowing nail    Migraine    Morbid obesity (HCC)    Obstructive sleep apnea (adult) (pediatric)    Osteoarthrosis, unspecified whether generalized or localized, unspecified site    Other abnormal blood chemistry    Other abnormal glucose    Other seborrheic keratosis    Pain in joint, site unspecified    Sarcoidosis    Unspecified hearing loss    Unspecified hypertrophic and atrophic condition of skin    Unspecified tinnitus    Unspecified urinary incontinence    Vertigo    Vertigo     Current Outpatient Medications:    acetaminophen (TYLENOL) 325 MG tablet, Take 650 mg by mouth as needed., Disp: , Rfl:    albuterol (PROVENTIL HFA;VENTOLIN HFA) 108 (90 BASE) MCG/ACT inhaler, Inhale into the lungs every 6 (six) hours as needed for wheezing or shortness of breath., Disp: , Rfl:    allopurinol (ZYLOPRIM) 100 MG tablet, Take 100 mg by mouth daily., Disp: , Rfl:    ALPRAZolam (XANAX) 0.5 MG tablet, Take 0.5 mg by mouth at bedtime as needed for anxiety., Disp: , Rfl:    amLODipine-olmesartan (AZOR) 5-20 MG per tablet, Take 1 tablet by mouth daily.,  Disp: , Rfl:    aspirin 81 MG tablet, Take 81 mg by mouth daily., Disp: , Rfl:    calcitRIOL (ROCALTROL) 0.25 MCG capsule, , Disp: , Rfl:    cetirizine (ZYRTEC) 10 MG tablet, Take 1 tablet (10 mg total) by mouth daily., Disp: 30 tablet, Rfl: 5   clonazePAM (KLONOPIN) 0.5 MG tablet, Take 0.5 mg by mouth daily as needed., Disp: , Rfl:    cyclobenzaprine (FLEXERIL) 5 MG tablet, Take 1-2 tablets (5-10 mg total) by mouth 3 (three) times daily as needed for muscle spasms., Disp: 20 tablet, Rfl: 0   desonide (DESOWEN) 0.05 % lotion, Apply topically 2 (two) times daily., Disp: , Rfl:    diazepam (VALIUM) 2 MG tablet, TAKE ONE TABLET EVERY SIX (6) HOURS AS NEEDED FOR ANXIETY, Disp: , Rfl:    DULoxetine (CYMBALTA) 20 MG capsule, Take 20 mg by mouth., Disp: , Rfl:    Ergocalciferol (VITAMIN D2) 2000 UNITS TABS, Take by mouth daily., Disp: , Rfl:    FLUoxetine (PROZAC) 40 MG capsule, Take 40 mg by mouth daily., Disp: , Rfl:    fluticasone (FLONASE) 50 MCG/ACT nasal spray, Place 2 sprays into both nostrils as needed. , Disp: , Rfl:    furosemide (LASIX) 40 MG  tablet, Take 1 tablet (40 mg total) by mouth 2 (two) times daily., Disp: 180 tablet, Rfl: 3   hydrOXYzine (ATARAX/VISTARIL) 10 MG tablet, Take 10 mg by mouth as needed., Disp: , Rfl:    omeprazole (PRILOSEC) 40 MG capsule, Take 40 mg by mouth daily., Disp: , Rfl:    ondansetron (ZOFRAN) 8 MG tablet, Take 8 mg by mouth., Disp: , Rfl:    polyethylene glycol powder (GLYCOLAX/MIRALAX) 17 GM/SCOOP powder, Take by mouth., Disp: , Rfl:    potassium chloride SA (K-DUR,KLOR-CON) 20 MEQ tablet, Take 1 tablet (20 mEq total) by mouth daily., Disp: 90 tablet, Rfl: 3   prazosin (MINIPRESS) 1 MG capsule, Take 1 mg by mouth at bedtime., Disp: , Rfl:    rosuvastatin (CRESTOR) 20 MG tablet, Take 20 mg by mouth daily., Disp: , Rfl:    Semaglutide,0.25 or 0.'5MG'$ /DOS, 2 MG/1.5ML SOPN, Inject into the skin., Disp: , Rfl:    sodium chloride (OCEAN) 0.65 % nasal spray, 1 spray  by Each Nare route daily., Disp: , Rfl:    solifenacin (VESICARE) 10 MG tablet, Take 10 mg by mouth daily., Disp: , Rfl:    spironolactone (ALDACTONE) 25 MG tablet, Take 25 mg by mouth daily., Disp: , Rfl:    topiramate (TOPAMAX) 25 MG tablet, , Disp: , Rfl:    venlafaxine XR (EFFEXOR-XR) 150 MG 24 hr capsule, Take 150 mg by mouth daily., Disp: , Rfl:    verapamil (CALAN-SR) 120 MG CR tablet, Take 120 mg by mouth daily., Disp: , Rfl:    WIXELA INHUB 250-50 MCG/DOSE AEPB, , Disp: , Rfl:   Social History   Tobacco Use  Smoking Status Former   Packs/day: 1.00   Years: 25.00   Total pack years: 25.00   Types: Cigarettes  Smokeless Tobacco Never    Allergies  Allergen Reactions   Bee Venom Shortness Of Breath    swelling   Metformin Other (See Comments)    Kidney issues   Gabapentin Other (See Comments)    Causes constipation   Ibuprofen     Kidney injury   Niaspan [Niacin Er] Other (See Comments)    Hot flashes   Welchol [Colesevelam Hcl] Nausea And Vomiting    indigestion   Colesevelam Nausea And Vomiting    Indigestion, burning all over   Objective:  There were no vitals filed for this visit. There is no height or weight on file to calculate BMI. Constitutional Well developed. Well nourished.  Vascular Dorsalis pedis pulses palpable bilaterally. Posterior tibial pulses palpable bilaterally. Capillary refill normal to all digits.  No cyanosis or clubbing noted. Pedal hair growth normal.  Neurologic Normal speech. Oriented to person, place, and time. Epicritic sensation to light touch grossly present bilaterally.  Dermatologic Nail Exam: Pt has thick disfigured discolored nails with subungual debris noted bilateral entire nail hallux through fifth toenails.  Pain on palpation to the nails. Except left hallux. No nail noted to the left hallux No open wounds. No skin lesions.  Orthopedic:  Mild pain on palpation along the course of the posterior tibial tendon and as well  as his insertion.  Mild pain with dorsiflexion and eversion of the foot.  Mild pain with inversion plantarflexion of the foot active and passive.  No pain at the peroneal tendon, ATFL ligament, Achilles tendon.   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 Plantar posterior heel spurring noted.  History of previous fifth metatarsal base fracture  noted.  No other osseous abnormalities noted. Assessment:   1. Pain due to onychomycosis of toenails of both feet        Plan:  Patient was evaluated and treated and all questions answered.  Onychomycosis with pain  -Nails palliatively debrided as below. -Educated on self-care  Procedure: Nail Debridement Rationale: pain  Type of Debridement: manual, sharp debridement. Instrumentation: Nail nipper, rotary burr. Number of Nails: 9  Procedures and Treatment: Consent by patient was obtained for treatment procedures. The patient understood the discussion of treatment and procedures well. All questions were answered thoroughly reviewed. Debridement of mycotic and hypertrophic toenails, 1 through 5 bilateral and clearing of subungual debris. No ulceration, no infection noted.  Return Visit-Office Procedure: Patient instructed to return to the office for a follow up visit 3 months for continued evaluation and treatment.  Boneta Lucks, DPM    No follow-ups on file.   No follow-ups on file.

## 2023-01-31 ENCOUNTER — Ambulatory Visit: Payer: Medicare Other | Admitting: Podiatry

## 2023-02-28 ENCOUNTER — Ambulatory Visit (INDEPENDENT_AMBULATORY_CARE_PROVIDER_SITE_OTHER): Payer: Medicare HMO | Admitting: Podiatry

## 2023-02-28 DIAGNOSIS — M79675 Pain in left toe(s): Secondary | ICD-10-CM | POA: Diagnosis not present

## 2023-02-28 DIAGNOSIS — B351 Tinea unguium: Secondary | ICD-10-CM

## 2023-02-28 DIAGNOSIS — M79674 Pain in right toe(s): Secondary | ICD-10-CM

## 2023-02-28 NOTE — Progress Notes (Signed)
Subjective:  Patient ID: Joyce Fields, female    DOB: 27-Oct-1945,  MRN: [id number]  Chief Complaint  Patient presents with   Nail Problem    Nail trim    77 y.o. female presents with the above complaint.  Patient presents with thickened elongated dystrophic toenails x10.  Mild pain on palpation.  Patient would like to have them debrided down.  She denies any other acute complaints   Review of Systems: Negative except as noted in the HPI. Denies N/V/F/Ch.  Past Medical History:  Diagnosis Date   Allergic rhinitis, cause unspecified    Allergy    Anemia    Anemia, unspecified    Apnea    CPAP   Arthritis    Asthma    Carpal tunnel syndrome    Dizziness and giddiness    Dysmetabolic syndrome X    GERD (gastroesophageal reflux disease)    Gout    Headache(784.0)    Heart murmur    Hemorrhoid 2012   Hyperlipidemia    Hypertension    Ingrowing nail    Migraine    Morbid obesity (HCC)    Obstructive sleep apnea (adult) (pediatric)    Osteoarthrosis, unspecified whether generalized or localized, unspecified site    Other abnormal blood chemistry    Other abnormal glucose    Other seborrheic keratosis    Pain in joint, site unspecified    Sarcoidosis    Unspecified hearing loss    Unspecified hypertrophic and atrophic condition of skin    Unspecified tinnitus    Unspecified urinary incontinence    Vertigo    Vertigo     Current Outpatient Medications:    acetaminophen (TYLENOL) 325 MG tablet, Take 650 mg by mouth as needed., Disp: , Rfl:    albuterol (PROVENTIL HFA;VENTOLIN HFA) 108 (90 BASE) MCG/ACT inhaler, Inhale into the lungs every 6 (six) hours as needed for wheezing or shortness of breath., Disp: , Rfl:    allopurinol (ZYLOPRIM) 100 MG tablet, Take 100 mg by mouth daily., Disp: , Rfl:    ALPRAZolam (XANAX) 0.5 MG tablet, Take 0.5 mg by mouth at bedtime as needed for anxiety., Disp: , Rfl:    amLODipine-olmesartan (AZOR) 5-20 MG per tablet, Take 1 tablet by  mouth daily., Disp: , Rfl:    aspirin 81 MG tablet, Take 81 mg by mouth daily., Disp: , Rfl:    calcitRIOL (ROCALTROL) 0.25 MCG capsule, , Disp: , Rfl:    cetirizine (ZYRTEC) 10 MG tablet, Take 1 tablet (10 mg total) by mouth daily., Disp: 30 tablet, Rfl: 5   clonazePAM (KLONOPIN) 0.5 MG tablet, Take 0.5 mg by mouth daily as needed., Disp: , Rfl:    cyclobenzaprine (FLEXERIL) 5 MG tablet, Take 1-2 tablets (5-10 mg total) by mouth 3 (three) times daily as needed for muscle spasms., Disp: 20 tablet, Rfl: 0   desonide (DESOWEN) 0.05 % lotion, Apply topically 2 (two) times daily., Disp: , Rfl:    diazepam (VALIUM) 2 MG tablet, TAKE ONE TABLET EVERY SIX (6) HOURS AS NEEDED FOR ANXIETY, Disp: , Rfl:    DULoxetine (CYMBALTA) 20 MG capsule, Take 20 mg by mouth., Disp: , Rfl:    Ergocalciferol (VITAMIN D2) 2000 UNITS TABS, Take by mouth daily., Disp: , Rfl:    FLUoxetine (PROZAC) 40 MG capsule, Take 40 mg by mouth daily., Disp: , Rfl:    fluticasone (FLONASE) 50 MCG/ACT nasal spray, Place 2 sprays into both nostrils as needed. , Disp: , Rfl:  furosemide (LASIX) 40 MG tablet, Take 1 tablet (40 mg total) by mouth 2 (two) times daily., Disp: 180 tablet, Rfl: 3   hydrOXYzine (ATARAX/VISTARIL) 10 MG tablet, Take 10 mg by mouth as needed., Disp: , Rfl:    omeprazole (PRILOSEC) 40 MG capsule, Take 40 mg by mouth daily., Disp: , Rfl:    ondansetron (ZOFRAN) 8 MG tablet, Take 8 mg by mouth., Disp: , Rfl:    polyethylene glycol powder (GLYCOLAX/MIRALAX) 17 GM/SCOOP powder, Take by mouth., Disp: , Rfl:    potassium chloride SA (K-DUR,KLOR-CON) 20 MEQ tablet, Take 1 tablet (20 mEq total) by mouth daily., Disp: 90 tablet, Rfl: 3   prazosin (MINIPRESS) 1 MG capsule, Take 1 mg by mouth at bedtime., Disp: , Rfl:    rosuvastatin (CRESTOR) 20 MG tablet, Take 20 mg by mouth daily., Disp: , Rfl:    Semaglutide,0.25 or 0.5MG /DOS, 2 MG/1.5ML SOPN, Inject into the skin., Disp: , Rfl:    sodium chloride (OCEAN) 0.65 % nasal  spray, 1 spray by Each Nare route daily., Disp: , Rfl:    solifenacin (VESICARE) 10 MG tablet, Take 10 mg by mouth daily., Disp: , Rfl:    spironolactone (ALDACTONE) 25 MG tablet, Take 25 mg by mouth daily., Disp: , Rfl:    topiramate (TOPAMAX) 25 MG tablet, , Disp: , Rfl:    venlafaxine XR (EFFEXOR-XR) 150 MG 24 hr capsule, Take 150 mg by mouth daily., Disp: , Rfl:    verapamil (CALAN-SR) 120 MG CR tablet, Take 120 mg by mouth daily., Disp: , Rfl:    WIXELA INHUB 250-50 MCG/DOSE AEPB, , Disp: , Rfl:   Social History   Tobacco Use  Smoking Status Former   Packs/day: 1.00   Years: 25.00   Additional pack years: 0.00   Total pack years: 25.00   Types: Cigarettes  Smokeless Tobacco Never    Allergies  Allergen Reactions   Bee Venom Shortness Of Breath    swelling   Metformin Other (See Comments)    Kidney issues   Gabapentin Other (See Comments)    Causes constipation   Ibuprofen     Kidney injury   Niaspan [Niacin Er] Other (See Comments)    Hot flashes   Welchol [Colesevelam Hcl] Nausea And Vomiting    indigestion   Colesevelam Nausea And Vomiting    Indigestion, burning all over   Objective:  There were no vitals filed for this visit. There is no height or weight on file to calculate BMI. Constitutional Well developed. Well nourished.  Vascular Dorsalis pedis pulses palpable bilaterally. Posterior tibial pulses palpable bilaterally. Capillary refill normal to all digits.  No cyanosis or clubbing noted. Pedal hair growth normal.  Neurologic Normal speech. Oriented to person, place, and time. Epicritic sensation to light touch grossly present bilaterally.  Dermatologic Nail Exam: Pt has thick disfigured discolored nails with subungual debris noted bilateral entire nail hallux through fifth toenails.  Pain on palpation to the nails. Except left hallux. No nail noted to the left hallux No open wounds. No skin lesions.  Orthopedic:  Mild pain on palpation along the  course of the posterior tibial tendon and as well as his insertion.  Mild pain with dorsiflexion and eversion of the foot.  Mild pain with inversion plantarflexion of the foot active and passive.  No pain at the peroneal tendon, ATFL ligament, Achilles tendon.   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 Plantar posterior heel  spurring noted.  History of previous fifth metatarsal base fracture noted.  No other osseous abnormalities noted. Assessment:   1. Pain due to onychomycosis of toenails of both feet        Plan:  Patient was evaluated and treated and all questions answered.  Onychomycosis with pain  -Nails palliatively debrided as below. -Educated on self-care  Procedure: Nail Debridement Rationale: pain  Type of Debridement: manual, sharp debridement. Instrumentation: Nail nipper, rotary burr. Number of Nails: 9  Procedures and Treatment: Consent by patient was obtained for treatment procedures. The patient understood the discussion of treatment and procedures well. All questions were answered thoroughly reviewed. Debridement of mycotic and hypertrophic toenails, 1 through 5 bilateral and clearing of subungual debris. No ulceration, no infection noted.  Return Visit-Office Procedure: Patient instructed to return to the office for a follow up visit 3 months for continued evaluation and treatment.  Nicholes Rough, DPM    No follow-ups on file.   No follow-ups on file.

## 2023-06-01 ENCOUNTER — Ambulatory Visit: Payer: Medicare HMO | Admitting: Podiatry

## 2023-06-06 ENCOUNTER — Ambulatory Visit: Payer: Medicare HMO | Admitting: Podiatry
